# Patient Record
Sex: Male | Born: 1980
Health system: Southern US, Community
[De-identification: ages and names within clinical notes are randomized; demographics above are authoritative.]

## PROBLEM LIST (undated history)

## (undated) DIAGNOSIS — K819 Cholecystitis, unspecified: Secondary | ICD-10-CM

## (undated) DIAGNOSIS — K859 Acute pancreatitis without necrosis or infection, unspecified: Secondary | ICD-10-CM

## (undated) DIAGNOSIS — E669 Obesity, unspecified: Secondary | ICD-10-CM

## (undated) DIAGNOSIS — K8020 Calculus of gallbladder without cholecystitis without obstruction: Secondary | ICD-10-CM

## (undated) HISTORY — PX: HAND SURGERY: SHX662

---

## 2011-09-21 ENCOUNTER — Emergency Department (HOSPITAL_BASED_OUTPATIENT_CLINIC_OR_DEPARTMENT_OTHER)
Admission: EM | Admit: 2011-09-21 | Discharge: 2011-09-21 | Disposition: A | Discharge status: Home / Self Care | Payer: Self-pay | Attending: Emergency Medicine | Admitting: Emergency Medicine

## 2011-09-21 ENCOUNTER — Encounter (HOSPITAL_BASED_OUTPATIENT_CLINIC_OR_DEPARTMENT_OTHER): Payer: Self-pay | Admitting: Family Medicine

## 2011-09-21 ENCOUNTER — Emergency Department (HOSPITAL_BASED_OUTPATIENT_CLINIC_OR_DEPARTMENT_OTHER): Payer: Self-pay

## 2011-09-21 DIAGNOSIS — M7050 Other bursitis of knee, unspecified knee: Secondary | ICD-10-CM

## 2011-09-21 DIAGNOSIS — F17200 Nicotine dependence, unspecified, uncomplicated: Secondary | ICD-10-CM | POA: Insufficient documentation

## 2011-09-21 DIAGNOSIS — M7040 Prepatellar bursitis, unspecified knee: Secondary | ICD-10-CM | POA: Insufficient documentation

## 2011-09-21 MED ORDER — NAPROXEN 500 MG PO TABS: 500.0000 mg | ORAL_TABLET | Freq: Two times a day (BID) | ORAL | Status: DC

## 2011-09-21 MED ORDER — OXYCODONE-ACETAMINOPHEN 5-325 MG PO TABS: 1.0000 | ORAL_TABLET | Freq: Four times a day (QID) | ORAL | Status: AC | PRN

## 2011-09-21 NOTE — ED Notes (Signed)
Pt c/o right knee pain x 3 days while kneeling on it. Pt sts pain worse with bending. Pt using ice and aleve at home for pain control. Pt reports injury to same in past.

## 2011-09-21 NOTE — ED Provider Notes (Addendum)
History     CSN: [id number]  Arrival date & time 09/21/11  1004   First MD Initiated Contact with Patient 09/21/11 1042      Chief Complaint  Patient presents with  . Knee Pain    (Consider location/radiation/quality/duration/timing/severity/associated sxs/prior treatment) Patient is a 31 y.o. male presenting with knee pain. The history is provided by the patient.  Knee Pain This is a new problem. Episode onset: 3 days. The problem occurs constantly. The problem has been gradually improving. Associated symptoms comments: No fever, pain with prolonged standing. The symptoms are aggravated by standing and walking. The symptoms are relieved by ice and relaxation. He has tried rest (Ice and ibuprofen) for the symptoms. The treatment provided mild relief.    History reviewed. No pertinent past medical history.  History reviewed. No pertinent past surgical history.  No family history on file.  History  Substance Use Topics  . Smoking status: Current Some Day Smoker  . Smokeless tobacco: Not on file  . Alcohol Use: Yes     occ      Review of Systems  All other systems reviewed and are negative.    Allergies  Review of patient's allergies indicates no known allergies.  Home Medications  No current outpatient prescriptions on file.  BP 145/82  Pulse 80  Temp 97.9 F (36.6 C) (Oral)  Resp 20  Ht 6\' 2"  (1.88 m)  Wt 355 lb (161.027 kg)  BMI 45.58 kg/m2  SpO2 100%  Physical Exam  Nursing note and vitals reviewed. Constitutional: He is oriented to person, place, and time. He appears well-developed and well-nourished. No distress.  HENT:  Head: Normocephalic and atraumatic.  Mouth/Throat: Oropharynx is clear and moist.  Eyes: Conjunctivae and EOM are normal. Pupils are equal, round, and reactive to light.  Neck: Normal range of motion. Neck supple.  Musculoskeletal: Normal range of motion. He exhibits no edema and no tenderness.       Right knee: He exhibits  swelling, effusion and erythema. He exhibits normal range of motion.       Legs: Neurological: He is alert and oriented to person, place, and time.  Skin: Skin is warm and dry. No rash noted. No erythema.  Psychiatric: He has a normal mood and affect. His behavior is normal.    ED Course  Procedures (including critical care time)  Labs Reviewed - No data to display Dg Knee Complete 4 Views Right  09/21/2011  *RADIOLOGY REPORT*  Clinical Data: Knee pain and swelling, fall 1 month ago  RIGHT KNEE - COMPLETE 4+ VIEW  Comparison: None.  Findings: Mild medial compartmental degenerative change is noted. No suprapatellar effusion.  No fracture dislocation.  Radiopaque foreign body.  IMPRESSION: Mild medial compartmental degenerative change.  No acute finding.  Original Report Authenticated By: Harrel Lemon, M.D.     1. Patellar bursitis       MDM   Patient with findings most suggestive of a patellar bursitis. He states he injured his knee one month ago but 3 days ago with kneeling on and it's swelled causing pain and redness. On exam however patient complexes knee past 90 and this does not appear to be septic joint. Plain film shows mild arthritis but otherwise within normal limits. Patient's patella bursa is erythematous and warm. Patient was placed on anti-inflammatories and given pain control. He was given followup to sports med       Gwyneth Sprout, MD 09/21/11 1110  Gwyneth Sprout, MD 09/21/11 1112

## 2011-09-21 NOTE — ED Notes (Signed)
Patient reports injury to the right knee about 1 month ago; patient reports increase pain, swelling and redness this week.

## 2012-03-08 ENCOUNTER — Other Ambulatory Visit: Payer: Self-pay | Admitting: Family Medicine

## 2012-03-08 DIAGNOSIS — R109 Unspecified abdominal pain: Secondary | ICD-10-CM

## 2012-03-14 ENCOUNTER — Ambulatory Visit
Admission: RE | Admit: 2012-03-14 | Discharge: 2012-03-14 | Disposition: A | Discharge status: Home / Self Care | Payer: Self-pay | Source: Ambulatory Visit | Attending: Family Medicine | Admitting: Family Medicine

## 2012-03-14 DIAGNOSIS — R109 Unspecified abdominal pain: Secondary | ICD-10-CM

## 2012-03-18 ENCOUNTER — Encounter (INDEPENDENT_AMBULATORY_CARE_PROVIDER_SITE_OTHER): Payer: Self-pay | Admitting: Surgery

## 2012-03-22 ENCOUNTER — Ambulatory Visit (INDEPENDENT_AMBULATORY_CARE_PROVIDER_SITE_OTHER): Payer: 59 | Admitting: Surgery

## 2012-03-22 ENCOUNTER — Encounter (INDEPENDENT_AMBULATORY_CARE_PROVIDER_SITE_OTHER): Payer: Self-pay | Admitting: Surgery

## 2012-03-22 VITALS — BP 119/82 | HR 75 | Temp 98.4°F | Resp 14 | Ht 74.0 in | Wt 373.2 lb

## 2012-03-22 DIAGNOSIS — Z6841 Body Mass Index (BMI) 40.0 and over, adult: Secondary | ICD-10-CM | POA: Insufficient documentation

## 2012-03-22 DIAGNOSIS — K8010 Calculus of gallbladder with chronic cholecystitis without obstruction: Secondary | ICD-10-CM

## 2012-03-22 NOTE — Progress Notes (Signed)
Patient ID: Adam Rosario, male   DOB: 11-Jan-1981, 32 y.o.   MRN: [id number]  Chief Complaint  Patient presents with  . Cholelithiasis    HPI Adam Rosario is a 32 y.o. male.  Referred by Dr. Mila Palmer for evaluation of gallbladder disease  HPI This is a 32 year old male who presents with a recent history of right upper quadrant abdominal pain. This does get worse with eating. This pain is associated with nausea and vomiting. This tends to occur more at night. He denies any fever. His appetite has been poor for the last several days. He was diagnosed with gallstones several years ago but never had surgery. Dr. Paulino Rily obtained a CBC and liver function tests which were all normal. An ultrasound dated 03/14/12 showed multiple gallstones but no sign of cholecystitis. The patient is now referred for surgical evaluation.  PMH - Morbid obesity - BMI 48  Past Surgical History  Procedure Date  . Hand surgery     left    No family history on file.  Social History History  Substance Use Topics  . Smoking status: Current Some Day Smoker -- 0.5 packs/day    Types: Cigarettes  . Smokeless tobacco: Not on file  . Alcohol Use: Yes     Comment: occ    No Known Allergies  Current Outpatient Prescriptions  Medication Sig Dispense Refill  . naproxen sodium (ANAPROX) 220 MG tablet Take 220 mg by mouth as needed.        Review of Systems Review of Systems  Constitutional: Negative for fever, chills and unexpected weight change.  HENT: Negative for hearing loss, congestion, sore throat, trouble swallowing and voice change.   Eyes: Negative for visual disturbance.  Respiratory: Negative for cough and wheezing.   Cardiovascular: Negative for chest pain, palpitations and leg swelling.  Gastrointestinal: Positive for nausea, vomiting, abdominal pain and abdominal distention. Negative for diarrhea, constipation, blood in stool, anal bleeding and rectal pain.  Genitourinary: Negative for  hematuria and difficulty urinating.  Musculoskeletal: Negative for arthralgias.  Skin: Negative for rash and wound.  Neurological: Negative for seizures, syncope, weakness and headaches.  Hematological: Negative for adenopathy. Does not bruise/bleed easily.  Psychiatric/Behavioral: Negative for confusion.    Blood pressure 119/82, pulse 75, temperature 98.4 F (36.9 C), temperature source Temporal, resp. rate 14, height 6\' 2"  (1.88 m), weight 373 lb 3.2 oz (169.282 kg).  Physical Exam Physical Exam Obese male in NAD HEENT:  EOMI, sclera anicteric Neck:  No masses, no thyromegaly Lungs:  CTA bilaterally; normal respiratory effort CV:  Regular rate and rhythm; no murmurs Abd:  +bowel sounds, obese; soft, mild RUQ tenderness Ext:  Well-perfused; no edema Skin:  Warm, dry; no sign of jaundice   Data Reviewed RADIOLOGY REPORT*  Clinical Data: Abdominal pain. Nausea and vomiting.  ABDOMINAL ULTRASOUND COMPLETE  Comparison: None.  Findings: Technically difficult exam due to body habitus.  Gallbladder: Multiple gallstones are seen measuring up to  approximately centimeters in diameter. There is no evidence of  gallbladder dilatation wall thickening. No sonographic Murphy's  sign noted by sonographer.  Common Bile Duct: Within normal limits in caliber. Measures 4 mm  in diameter.  Liver: Diffusely increased parenchymal echogenicity, consistent  with hepatic steatosis. No focal liver mass identified.  IVC: Not well visualized due to patient habitus and steatosis.  Pancreas: Not well visualized due to habitus and overlying bowel  gas.  Spleen: Within normal limits in size and echotexture.  Right kidney: Normal in  size and parenchymal echogenicity. No  evidence of mass or hydronephrosis.  Left kidney: Normal in size and parenchymal echogenicity. No  evidence of mass or hydronephrosis.  Abdominal Aorta: No aneurysm identified.  IMPRESSION:  1. Cholelithiasis. No definite signs of  acute cholecystitis or  biliary dilatation.  2. Hepatic steatosis.  3. Technically difficult exam.  Original Report Authenticated By: Myles Rosenthal, M.D.      Assessment    Chronic calculus cholecystitis Morbid obesity    Plan    Laparoscopic cholecystectomy with intraoperative cholangiogram.  The surgical procedure has been discussed with the patient.  Potential risks, benefits, alternative treatments, and expected outcomes have been explained.  All of the patient's questions at this time have been answered.  The likelihood of reaching the patient's treatment goal is good.  The patient understand the proposed surgical procedure and wishes to proceed.        Yani Lal K. 03/22/2012, 12:26 PM

## 2013-02-12 ENCOUNTER — Encounter (HOSPITAL_BASED_OUTPATIENT_CLINIC_OR_DEPARTMENT_OTHER): Payer: Self-pay | Admitting: Emergency Medicine

## 2013-02-12 ENCOUNTER — Emergency Department (HOSPITAL_BASED_OUTPATIENT_CLINIC_OR_DEPARTMENT_OTHER)
Admission: EM | Admit: 2013-02-12 | Discharge: 2013-02-12 | Disposition: A | Discharge status: Home / Self Care | Payer: 59 | Attending: Emergency Medicine | Admitting: Emergency Medicine

## 2013-02-12 DIAGNOSIS — K219 Gastro-esophageal reflux disease without esophagitis: Secondary | ICD-10-CM | POA: Insufficient documentation

## 2013-02-12 DIAGNOSIS — F17200 Nicotine dependence, unspecified, uncomplicated: Secondary | ICD-10-CM | POA: Insufficient documentation

## 2013-02-12 MED ORDER — PANTOPRAZOLE SODIUM 20 MG PO TBEC: 20.0000 mg | DELAYED_RELEASE_TABLET | Freq: Two times a day (BID) | ORAL | Status: DC

## 2013-02-12 MED ORDER — GI COCKTAIL ~~LOC~~
30.0000 mL | Freq: Once | ORAL | Status: AC
  Administered 2013-02-12: 30 mL via ORAL
  Filled 2013-02-12: qty 30

## 2013-02-12 NOTE — ED Notes (Signed)
Pt c/o " severe heartburn" x 2 days

## 2013-02-12 NOTE — ED Notes (Signed)
EKG Canceled by MD Ray

## 2013-02-12 NOTE — ED Provider Notes (Addendum)
CSN: [id number]     Arrival date & time 02/12/13  1417 History   First MD Initiated Contact with Patient 02/12/13 1504     Chief Complaint  Patient presents with  . Heartburn   (Consider location/radiation/quality/duration/timing/severity/associated sxs/prior Treatment) HPI This is a morbidly obese 32 year old male who comes today complaining that he has had some of yellowish type material reflux into the back of his throat intermittently over the past several days.  He states that he was diagnosed during the past year but gallstones but was unable to have surgery. He states he has been eating better since that time but 8 some pizza several days ago with a "messed with him". He states he has some occasional pain in the right upper quadrant but more with moving van with food. He has had a couple episodes of vomiting but states it has chiefly just been reflux the back of his throat. He states there is some slight burning with this but is not really complaining of any pain. He denies any associated symptoms such as fever, chills, diarrhea, or shortness of breath. He has had some weight loss does not think it has very much. He is a smoker.He drinks some alcohol but has not had any recently History reviewed. No pertinent past medical history. Past Surgical History  Procedure Laterality Date  . Hand surgery      left   History reviewed. No pertinent family history. History  Substance Use Topics  . Smoking status: Current Some Day Smoker -- 0.50 packs/day    Types: Cigarettes  . Smokeless tobacco: Not on file  . Alcohol Use: Yes     Comment: occ    Review of Systems  All other systems reviewed and are negative.    Allergies  Review of patient's allergies indicates no known allergies.  Home Medications  No current outpatient prescriptions on file. BP 156/99  Pulse 65  Temp(Src) 98.1 F (36.7 C) (Oral)  Resp 16  Ht 6\' 1"  (1.854 m)  Wt 375 lb (170.099 kg)  BMI 49.49 kg/m2  SpO2  99% Physical Exam  Nursing note and vitals reviewed. Constitutional: He is oriented to person, place, and time. He appears well-developed and well-nourished.  Morbidly obese male  HENT:  Head: Normocephalic and atraumatic.  Right Ear: External ear normal.  Left Ear: External ear normal.  Nose: Nose normal.  Mouth/Throat: Oropharynx is clear and moist.  Eyes: Conjunctivae and EOM are normal. Pupils are equal, round, and reactive to light.  Neck: Normal range of motion. Neck supple. No tracheal deviation present. No thyromegaly present.  Cardiovascular: Normal rate, normal heart sounds and intact distal pulses.   Pulmonary/Chest: Effort normal and breath sounds normal.  Abdominal: Soft. Bowel sounds are normal. He exhibits no mass. There is no tenderness. There is no rebound and no guarding.  Musculoskeletal: Normal range of motion.  Neurological: He is alert and oriented to person, place, and time. He has normal reflexes.  Skin: Skin is warm and dry.  Psychiatric: He has a normal mood and affect. His behavior is normal. Thought content normal.    ED Course  Procedures (including critical care time) Labs Review Labs Reviewed - No data to display Imaging Review No results found.  EKG Interpretation   None       MDM  No diagnosis found. Patient with symptoms consistent with reflux. He does not have pain and I have no concerns for this representing cardiac problems. I have discussed with him multiple  intervention she can do such as raising the head of his bed, weight loss, smoking cessation, and diet control. He voices understanding. He'll be placed on a short course of Protonix and advised to followup with her primary care physician. He does state that he is a note for work for [employer] and does not want to come in this evening.   Hilario Quarry, MD 02/12/13 1610  Hilario Quarry, MD 02/12/13 [phone]

## 2013-02-24 ENCOUNTER — Encounter (HOSPITAL_COMMUNITY): Payer: Self-pay | Admitting: Emergency Medicine

## 2013-02-24 ENCOUNTER — Other Ambulatory Visit: Payer: Self-pay | Admitting: Family Medicine

## 2013-02-24 ENCOUNTER — Emergency Department (HOSPITAL_COMMUNITY): Payer: 59

## 2013-02-24 ENCOUNTER — Inpatient Hospital Stay (HOSPITAL_COMMUNITY)
Admission: EM | Admit: 2013-02-24 | Discharge: 2013-02-28 | DRG: 417 | Disposition: A | Discharge status: Home / Self Care | Payer: 59 | Attending: Surgery | Admitting: Surgery

## 2013-02-24 DIAGNOSIS — K851 Biliary acute pancreatitis without necrosis or infection: Secondary | ICD-10-CM

## 2013-02-24 DIAGNOSIS — F17200 Nicotine dependence, unspecified, uncomplicated: Secondary | ICD-10-CM | POA: Diagnosis present

## 2013-02-24 DIAGNOSIS — K859 Acute pancreatitis without necrosis or infection, unspecified: Secondary | ICD-10-CM

## 2013-02-24 DIAGNOSIS — K8010 Calculus of gallbladder with chronic cholecystitis without obstruction: Secondary | ICD-10-CM | POA: Diagnosis present

## 2013-02-24 DIAGNOSIS — Z6841 Body Mass Index (BMI) 40.0 and over, adult: Secondary | ICD-10-CM

## 2013-02-24 DIAGNOSIS — K831 Obstruction of bile duct: Secondary | ICD-10-CM | POA: Diagnosis present

## 2013-02-24 DIAGNOSIS — K819 Cholecystitis, unspecified: Secondary | ICD-10-CM

## 2013-02-24 DIAGNOSIS — R1011 Right upper quadrant pain: Secondary | ICD-10-CM

## 2013-02-24 DIAGNOSIS — K8020 Calculus of gallbladder without cholecystitis without obstruction: Secondary | ICD-10-CM

## 2013-02-24 HISTORY — DX: Obesity, unspecified: E66.9

## 2013-02-24 HISTORY — DX: Acute pancreatitis without necrosis or infection, unspecified: K85.90

## 2013-02-24 HISTORY — DX: Cholecystitis, unspecified: K81.9

## 2013-02-24 HISTORY — DX: Calculus of gallbladder without cholecystitis without obstruction: K80.20

## 2013-02-24 LAB — URINALYSIS, ROUTINE W REFLEX MICROSCOPIC
GLUCOSE, UA: NEGATIVE mg/dL
Hgb urine dipstick: NEGATIVE
Ketones, ur: 15 mg/dL — AB
Nitrite: NEGATIVE
PH: 6 (ref 5.0–8.0)
Protein, ur: NEGATIVE mg/dL
Specific Gravity, Urine: 1.025 (ref 1.005–1.030)
Urobilinogen, UA: 1 mg/dL (ref 0.0–1.0)

## 2013-02-24 LAB — CBC WITH DIFFERENTIAL/PLATELET
BASOS ABS: 0 10*3/uL (ref 0.0–0.1)
BASOS PCT: 0 % (ref 0–1)
Eosinophils Absolute: 0.1 10*3/uL (ref 0.0–0.7)
Eosinophils Relative: 1 % (ref 0–5)
HEMATOCRIT: 41.3 % (ref 39.0–52.0)
Hemoglobin: 13.8 g/dL (ref 13.0–17.0)
Lymphocytes Relative: 32 % (ref 12–46)
Lymphs Abs: 1.6 10*3/uL (ref 0.7–4.0)
MCH: 28.3 pg (ref 26.0–34.0)
MCHC: 33.4 g/dL (ref 30.0–36.0)
MCV: 84.6 fL (ref 78.0–100.0)
MONO ABS: 0.6 10*3/uL (ref 0.1–1.0)
Monocytes Relative: 13 % — ABNORMAL HIGH (ref 3–12)
NEUTROS ABS: 2.6 10*3/uL (ref 1.7–7.7)
NEUTROS PCT: 53 % (ref 43–77)
PLATELETS: 305 10*3/uL (ref 150–400)
RBC: 4.88 MIL/uL (ref 4.22–5.81)
RDW: 14.4 % (ref 11.5–15.5)
WBC: 4.9 10*3/uL (ref 4.0–10.5)

## 2013-02-24 LAB — URINE MICROSCOPIC-ADD ON

## 2013-02-24 LAB — POCT I-STAT TROPONIN I: TROPONIN I, POC: 0 ng/mL (ref 0.00–0.08)

## 2013-02-24 LAB — LIPASE, BLOOD: Lipase: 804 U/L — ABNORMAL HIGH (ref 11–59)

## 2013-02-24 LAB — COMPREHENSIVE METABOLIC PANEL
ALBUMIN: 3.5 g/dL (ref 3.5–5.2)
ALT: 286 U/L — AB (ref 0–53)
AST: 128 U/L — AB (ref 0–37)
Alkaline Phosphatase: 318 U/L — ABNORMAL HIGH (ref 39–117)
BILIRUBIN TOTAL: 2.5 mg/dL — AB (ref 0.3–1.2)
BUN: 7 mg/dL (ref 6–23)
CHLORIDE: 103 meq/L (ref 96–112)
CO2: 26 mEq/L (ref 19–32)
CREATININE: 0.6 mg/dL (ref 0.50–1.35)
Calcium: 9.1 mg/dL (ref 8.4–10.5)
GFR calc Af Amer: >90 mL/min (ref >90)
GFR calc non Af Amer: >90 mL/min (ref >90)
Glucose, Bld: 103 mg/dL — ABNORMAL HIGH (ref 70–99)
Potassium: 3.9 mEq/L (ref 3.7–5.3)
SODIUM: 141 meq/L (ref 137–147)
Total Protein: 7.3 g/dL (ref 6.0–8.3)

## 2013-02-24 MED ORDER — MORPHINE SULFATE 4 MG/ML IJ SOLN
4.0000 mg | INTRAMUSCULAR | Status: DC | PRN
  Administered 2013-02-24: 4 mg via INTRAVENOUS
  Filled 2013-02-24: qty 1

## 2013-02-24 MED ORDER — ONDANSETRON HCL 4 MG/2ML IJ SOLN
4.0000 mg | Freq: Four times a day (QID) | INTRAMUSCULAR | Status: DC | PRN
Start: 2013-02-24 — End: 2013-02-28
  Administered 2013-02-24: 4 mg via INTRAVENOUS
  Filled 2013-02-24: qty 2

## 2013-02-24 NOTE — H&P (Signed)
Adam Rosario is an 33 y.o. male.   Chief Complaint: abdominal pain referred by Dr Rae Mar HPI: 68 yom who is morbidly obese and saw Dr Georgette Dover last year with symptomatic cholelithiasis.  He decided not to undergo surgery at that time for financial reasons.  Since then he has had some ruq pain intermittently after eating that goes away after a couple hours.  The current episode has lasted two days and consists of nausea, loose stools, ruq and epigastric pain that is not resolving. Nothing at home will help.  He has not been eating at all.  He saw pcp today and was referred to er after undergoing lab evaluation.    Past Medical History  Diagnosis Date  . Gallstones   morbid obesity  Past Surgical History  Procedure Laterality Date  . Hand surgery      left    No family history on file. Social History:  reports that he has been smoking Cigarettes.  He has been smoking about 0.50 packs per day. He does not have any smokeless tobacco history on file. He reports that he drinks alcohol. He reports that he does not use illicit drugs.  Allergies: No Known Allergies Meds reviewed  Results for orders placed during the hospital encounter of 02/24/13 (from the past 48 hour(s))  CBC WITH DIFFERENTIAL     Status: Abnormal   Collection Time    02/24/13  4:17 PM      Result Value Range   WBC 4.9  4.0 - 10.5 K/uL   RBC 4.88  4.22 - 5.81 MIL/uL   Hemoglobin 13.8  13.0 - 17.0 g/dL   HCT 41.3  39.0 - 52.0 %   MCV 84.6  78.0 - 100.0 fL   MCH 28.3  26.0 - 34.0 pg   MCHC 33.4  30.0 - 36.0 g/dL   RDW 14.4  11.5 - 15.5 %   Platelets 305  150 - 400 K/uL   Neutrophils Relative % 53  43 - 77 %   Neutro Abs 2.6  1.7 - 7.7 K/uL   Lymphocytes Relative 32  12 - 46 %   Lymphs Abs 1.6  0.7 - 4.0 K/uL   Monocytes Relative 13 (*) 3 - 12 %   Monocytes Absolute 0.6  0.1 - 1.0 K/uL   Eosinophils Relative 1  0 - 5 %   Eosinophils Absolute 0.1  0.0 - 0.7 K/uL   Basophils Relative 0  0 - 1 %   Basophils Absolute  0.0  0.0 - 0.1 K/uL  COMPREHENSIVE METABOLIC PANEL     Status: Abnormal   Collection Time    02/24/13  4:17 PM      Result Value Range   Sodium 141  137 - 147 mEq/L   Potassium 3.9  3.7 - 5.3 mEq/L   Chloride 103  96 - 112 mEq/L   CO2 26  19 - 32 mEq/L   Glucose, Bld 103 (*) 70 - 99 mg/dL   BUN 7  6 - 23 mg/dL   Creatinine, Ser 0.60  0.50 - 1.35 mg/dL   Calcium 9.1  8.4 - 10.5 mg/dL   Total Protein 7.3  6.0 - 8.3 g/dL   Albumin 3.5  3.5 - 5.2 g/dL   AST 128 (*) 0 - 37 U/L   ALT 286 (*) 0 - 53 U/L   Alkaline Phosphatase 318 (*) 39 - 117 U/L   Total Bilirubin 2.5 (*) 0.3 - 1.2 mg/dL   GFR  calc non Af Amer >90  >90 mL/min   GFR calc Af Amer >90  >90 mL/min   Comment: (NOTE)     The eGFR has been calculated using the CKD EPI equation.     This calculation has not been validated in all clinical situations.     eGFR's persistently <90 mL/min signify possible Chronic Kidney     Disease.  LIPASE, BLOOD     Status: Abnormal   Collection Time    02/24/13  4:17 PM      Result Value Range   Lipase 804 (*) 11 - 59 U/L  URINALYSIS, ROUTINE W REFLEX MICROSCOPIC     Status: Abnormal   Collection Time    02/24/13  4:36 PM      Result Value Range   Color, Urine AMBER (*) YELLOW   Comment: BIOCHEMICALS MAY BE AFFECTED BY COLOR   APPearance CLEAR  CLEAR   Specific Gravity, Urine 1.025  1.005 - 1.030   pH 6.0  5.0 - 8.0   Glucose, UA NEGATIVE  NEGATIVE mg/dL   Hgb urine dipstick NEGATIVE  NEGATIVE   Bilirubin Urine MODERATE (*) NEGATIVE   Ketones, ur 15 (*) NEGATIVE mg/dL   Protein, ur NEGATIVE  NEGATIVE mg/dL   Urobilinogen, UA 1.0  0.0 - 1.0 mg/dL   Nitrite NEGATIVE  NEGATIVE   Leukocytes, UA TRACE (*) NEGATIVE  URINE MICROSCOPIC-ADD ON     Status: None   Collection Time    02/24/13  4:36 PM      Result Value Range   Squamous Epithelial / LPF RARE  RARE   WBC, UA 0-2  <3 WBC/hpf   RBC / HPF 0-2  <3 RBC/hpf   Bacteria, UA RARE  RARE  POCT I-STAT TROPONIN I     Status: None    Collection Time    02/24/13  4:53 PM      Result Value Range   Troponin i, poc 0.00  0.00 - 0.08 ng/mL   Comment 3            Comment: Due to the release kinetics of cTnI,     a negative result within the first hours     of the onset of symptoms does not rule out     myocardial infarction with certainty.     If myocardial infarction is still suspected,     repeat the test at appropriate intervals.   US Abdomen Limited  02/24/2013   CLINICAL DATA:  Right upper quadrant abdominal pain. Current history of cholelithiasis.  EXAM: US ABDOMEN LIMITED - RIGHT UPPER QUADRANT  COMPARISON:  Abdominal ultrasound 03/14/2012.  FINDINGS: Gallbladder  Multiple shadowing gallstones, the largest approximating 10 mm. Mild gallbladder wall thickening up to approximately 5 mm. No pericholecystic fluid. Positive sonographic Murphy sign according to the ultrasound technologist.  Common bile duct  Diameter: 5 mm.  Liver:  Diffusely increased and coarsened echotexture without focal hepatic parenchymal abnormality. Patent portal vein with hepatopetal flow.  IMPRESSION: 1. Cholelithiasis and sonographic findings which are consistent with acute cholecystitis (gallbladder wall thickening and positive sonographic Murphy sign). 2. No biliary ductal dilation. 3. Diffuse hepatic steatosis and/or hepatocellular disease without focal hepatic parenchymal abnormality.   Electronically Signed   By: Evangeline Dakin M.D.   On: 02/24/2013 22:21    Review of Systems  Constitutional: Negative for fever.  Respiratory: Negative for shortness of breath.   Cardiovascular: Negative for chest pain.  Gastrointestinal: Positive for nausea, vomiting and abdominal pain.  Blood pressure 131/79, pulse 59, temperature 98.9 F (37.2 C), temperature source Oral, resp. rate 14, weight 375 lb 8 oz (170.326 kg), SpO2 98.00%. Physical Exam  Vitals reviewed. Constitutional: He appears well-developed and well-nourished.  Eyes: No scleral icterus.   Neck: Neck supple.  Cardiovascular: Normal rate, regular rhythm and normal heart sounds.   Respiratory: Effort normal and breath sounds normal. He has no wheezes. He has no rales.  GI: Soft. Bowel sounds are normal. He exhibits no distension. There is tenderness in the right upper quadrant and epigastric area.  Lymphadenopathy:    He has no cervical adenopathy.     Assessment/Plan Gallstone pancreatitis  Admission, npo, recheck labs in am, plan for lap chole this admission, discussed with patient and wife  [facility] 02/24/2013, 10:37 PM

## 2013-02-24 NOTE — ED Notes (Signed)
Pt reports he was advised last year to have his gall bladder removed, pt's insurance was only going to cover part of the procedure and therefore the pt was unable to have the procedure.

## 2013-02-24 NOTE — ED Notes (Signed)
Pt is here with right upper abdominal pain and known history of gallstones and last checked last year.  Pt reports vomiting yellow bile.  Stool is clear liquid.  No chest pain or sob

## 2013-02-24 NOTE — ED Provider Notes (Signed)
CSN: [id number]     Arrival date & time 02/24/13  1606 History   First MD Initiated Contact with Patient 02/24/13 2006     Chief Complaint  Patient presents with  . Abdominal Pain   (Consider location/radiation/quality/duration/timing/severity/associated sxs/prior Treatment) HPI Comments: Pt with hx of gall stones comes in with cc of abd pain. Pt has abd pain on the RUQ and epigastrium, started 2 days ago and is constant - with no specific aggravating or relieving factors. Pt has nausea and emesis, and loose BM. No blood in the stool. Saw surgeon last year after the dx, but didn't follow up thereafter.  Patient is a 33 y.o. male presenting with abdominal pain. The history is provided by the patient.  Abdominal Pain Associated symptoms: nausea and vomiting   Associated symptoms: no chest pain, no cough, no dysuria and no shortness of breath     Past Medical History  Diagnosis Date  . Gallstones    Past Surgical History  Procedure Laterality Date  . Hand surgery      left   No family history on file. History  Substance Use Topics  . Smoking status: Current Some Day Smoker -- 0.50 packs/day    Types: Cigarettes  . Smokeless tobacco: Not on file  . Alcohol Use: Yes     Comment: occ    Review of Systems  Constitutional: Negative for activity change and appetite change.  Respiratory: Negative for cough and shortness of breath.   Cardiovascular: Negative for chest pain.  Gastrointestinal: Positive for nausea, vomiting and abdominal pain. Negative for blood in stool.  Genitourinary: Negative for dysuria and flank pain.  Hematological: Does not bruise/bleed easily.    Allergies  Review of patient's allergies indicates no known allergies.  Home Medications   Current Outpatient Rx  Name  Route  Sig  Dispense  Refill  . Naproxen Sodium (ALEVE) 220 MG CAPS   Oral   Take 220 mg by mouth every 8 (eight) hours as needed (pain).         . pantoprazole (PROTONIX) 20 MG  tablet   Oral   Take 1 tablet (20 mg total) by mouth 2 (two) times daily.   40 tablet   0    BP 131/79  Pulse 59  Temp(Src) 98.9 F (37.2 C) (Oral)  Resp 14  Wt 375 lb 8 oz (170.326 kg)  SpO2 98% Physical Exam  Nursing note and vitals reviewed. Constitutional: He is oriented to person, place, and time. He appears well-developed.  HENT:  Head: Normocephalic and atraumatic.  Eyes: Conjunctivae and EOM are normal. Pupils are equal, round, and reactive to light.  Neck: Normal range of motion. Neck supple.  Cardiovascular: Normal rate and regular rhythm.   Pulmonary/Chest: Effort normal and breath sounds normal.  Abdominal: Soft. Bowel sounds are normal. He exhibits no distension. There is tenderness. There is guarding. There is no rebound.  RUQ tenderness - neg murphy's  Neurological: He is alert and oriented to person, place, and time.  Skin: Skin is warm.    ED Course  Procedures (including critical care time) Labs Review Labs Reviewed  CBC WITH DIFFERENTIAL - Abnormal; Notable for the following:    Monocytes Relative 13 (*)    All other components within normal limits  COMPREHENSIVE METABOLIC PANEL - Abnormal; Notable for the following:    Glucose, Bld 103 (*)    AST 128 (*)    ALT 286 (*)    Alkaline Phosphatase 318 (*)  Total Bilirubin 2.5 (*)    All other components within normal limits  LIPASE, BLOOD - Abnormal; Notable for the following:    Lipase 804 (*)    All other components within normal limits  URINALYSIS, ROUTINE W REFLEX MICROSCOPIC - Abnormal; Notable for the following:    Color, Urine AMBER (*)    Bilirubin Urine MODERATE (*)    Ketones, ur 15 (*)    Leukocytes, UA TRACE (*)    All other components within normal limits  URINE MICROSCOPIC-ADD ON  POCT I-STAT TROPONIN I   Imaging Review US Abdomen Limited  02/24/2013   CLINICAL DATA:  Right upper quadrant abdominal pain. Current history of cholelithiasis.  EXAM: US ABDOMEN LIMITED - RIGHT UPPER  QUADRANT  COMPARISON:  Abdominal ultrasound 03/14/2012.  FINDINGS: Gallbladder  Multiple shadowing gallstones, the largest approximating 10 mm. Mild gallbladder wall thickening up to approximately 5 mm. No pericholecystic fluid. Positive sonographic Murphy sign according to the ultrasound technologist.  Common bile duct  Diameter: 5 mm.  Liver:  Diffusely increased and coarsened echotexture without focal hepatic parenchymal abnormality. Patent portal vein with hepatopetal flow.  IMPRESSION: 1. Cholelithiasis and sonographic findings which are consistent with acute cholecystitis (gallbladder wall thickening and positive sonographic Murphy sign). 2. No biliary ductal dilation. 3. Diffuse hepatic steatosis and/or hepatocellular disease without focal hepatic parenchymal abnormality.   Electronically Signed   By: Hulan Saas M.D.   On: 02/24/2013 22:21    EKG Interpretation    Date/Time:  Monday February 24 2013 16:17:39 EST Ventricular Rate:  70 PR Interval:  158 QRS Duration: 82 QT Interval:  376 QTC Calculation: 406 R Axis:   64 Text Interpretation:  Normal sinus rhythm with sinus arrhythmia q wave III TWI III, and aVF Confirmed by Juleen China  MD, STEPHEN ([id number]) on 02/24/2013 4:25:35 PM            MDM   1. Cholecystitis   2. Pancreatitis   3. Gall stone pancreatitis    Pt comes in with cc of abd pain. Known hx of gall stones, and lab shows elevated lipase. [place] confirms cholecystitis - and surgery has admitted the patient. Pt informed of the findings.   Derwood Kaplan, MD 02/24/13 2259

## 2013-02-24 NOTE — ED Notes (Signed)
Attempted to give report 

## 2013-02-24 NOTE — ED Notes (Addendum)
[facility], states pt was sent from their office for abdominal pain with elevated lipid levels.

## 2013-02-25 ENCOUNTER — Encounter (HOSPITAL_COMMUNITY): Payer: Self-pay | Admitting: General Practice

## 2013-02-25 LAB — CBC
HEMATOCRIT: 38.7 % — AB (ref 39.0–52.0)
Hemoglobin: 13 g/dL (ref 13.0–17.0)
MCH: 28 pg (ref 26.0–34.0)
MCHC: 33.6 g/dL (ref 30.0–36.0)
MCV: 83.2 fL (ref 78.0–100.0)
Platelets: 301 10*3/uL (ref 150–400)
RBC: 4.65 MIL/uL (ref 4.22–5.81)
RDW: 14.5 % (ref 11.5–15.5)
WBC: 5.3 10*3/uL (ref 4.0–10.5)

## 2013-02-25 LAB — COMPREHENSIVE METABOLIC PANEL
ALK PHOS: 291 U/L — AB (ref 39–117)
ALT: 264 U/L — AB (ref 0–53)
AST: 144 U/L — ABNORMAL HIGH (ref 0–37)
Albumin: 3.3 g/dL — ABNORMAL LOW (ref 3.5–5.2)
BUN: 7 mg/dL (ref 6–23)
CO2: 25 mEq/L (ref 19–32)
Calcium: 8.9 mg/dL (ref 8.4–10.5)
Chloride: 102 mEq/L (ref 96–112)
Creatinine, Ser: 0.67 mg/dL (ref 0.50–1.35)
GFR calc non Af Amer: >90 mL/min (ref >90)
GLUCOSE: 88 mg/dL (ref 70–99)
POTASSIUM: 4.2 meq/L (ref 3.7–5.3)
Sodium: 141 mEq/L (ref 137–147)
Total Bilirubin: 1.9 mg/dL — ABNORMAL HIGH (ref 0.3–1.2)
Total Protein: 6.9 g/dL (ref 6.0–8.3)

## 2013-02-25 LAB — SURGICAL PCR SCREEN
MRSA, PCR: POSITIVE — AB
Staphylococcus aureus: POSITIVE — AB

## 2013-02-25 LAB — LIPASE, BLOOD: Lipase: 400 U/L — ABNORMAL HIGH (ref 11–59)

## 2013-02-25 MED ORDER — MUPIROCIN 2 % EX OINT
1.0000 "application " | TOPICAL_OINTMENT | Freq: Two times a day (BID) | CUTANEOUS | Status: DC
  Administered 2013-02-25 – 2013-02-27 (×5): 1 via NASAL
  Filled 2013-02-25 (×2): qty 22

## 2013-02-25 MED ORDER — CIPROFLOXACIN IN D5W 400 MG/200ML IV SOLN
400.0000 mg | INTRAVENOUS | Status: AC
  Administered 2013-02-26: 400 mg via INTRAVENOUS
  Filled 2013-02-25: qty 200

## 2013-02-25 MED ORDER — CHLORHEXIDINE GLUCONATE CLOTH 2 % EX PADS
6.0000 | MEDICATED_PAD | Freq: Every day | CUTANEOUS | Status: DC
  Administered 2013-02-26 – 2013-02-28 (×3): 6 via TOPICAL

## 2013-02-25 MED ORDER — HEPARIN SODIUM (PORCINE) 5000 UNIT/ML IJ SOLN
5000.0000 [IU] | Freq: Three times a day (TID) | INTRAMUSCULAR | Status: DC
  Administered 2013-02-25 – 2013-02-26 (×3): 5000 [IU] via SUBCUTANEOUS
  Filled 2013-02-25 (×3): qty 1

## 2013-02-25 MED ORDER — HEPARIN SODIUM (PORCINE) 5000 UNIT/ML IJ SOLN
5000.0000 [IU] | Freq: Three times a day (TID) | INTRAMUSCULAR | Status: DC
  Filled 2013-02-25 (×3): qty 1

## 2013-02-25 MED ORDER — ACETAMINOPHEN 650 MG RE SUPP: 650.0000 mg | Freq: Four times a day (QID) | RECTAL | Status: DC | PRN

## 2013-02-25 MED ORDER — MORPHINE SULFATE 2 MG/ML IJ SOLN
2.0000 mg | INTRAMUSCULAR | Status: DC | PRN
  Administered 2013-02-25 – 2013-02-27 (×3): 2 mg via INTRAVENOUS
  Filled 2013-02-25 (×3): qty 1

## 2013-02-25 MED ORDER — PANTOPRAZOLE SODIUM 40 MG IV SOLR
40.0000 mg | Freq: Every day | INTRAVENOUS | Status: DC
  Administered 2013-02-25 – 2013-02-27 (×4): 40 mg via INTRAVENOUS
  Filled 2013-02-25 (×5): qty 40

## 2013-02-25 MED ORDER — ACETAMINOPHEN 325 MG PO TABS
650.0000 mg | ORAL_TABLET | Freq: Four times a day (QID) | ORAL | Status: DC | PRN
  Administered 2013-02-27: 650 mg via ORAL
  Filled 2013-02-25: qty 2

## 2013-02-25 MED ORDER — ONDANSETRON HCL 4 MG/2ML IJ SOLN: 4.0000 mg | Freq: Four times a day (QID) | INTRAMUSCULAR | Status: DC | PRN

## 2013-02-25 MED ORDER — SODIUM CHLORIDE 0.9 % IV SOLN
INTRAVENOUS | Status: DC
  Administered 2013-02-25: 100 mL/h via INTRAVENOUS
  Administered 2013-02-25 – 2013-02-27 (×5): via INTRAVENOUS

## 2013-02-25 NOTE — Progress Notes (Signed)
Will need lap chole IOC in next 24 - 48 hours if pancreatitis improves.

## 2013-02-25 NOTE — Progress Notes (Signed)
Patient ID: Adam Rosario, male   DOB: [birth year], 33 y.o.   MRN: [id number]    Subjective: Pt feels ok today.  Some better.  No nausea.  Objective: Vital signs in last 24 hours: Temp:  [98.1 F (36.7 C)-98.9 F (37.2 C)] 98.4 F (36.9 C) (01/13 0555) Pulse Rate:  [59-68] 65 (01/13 0555) Resp:  [13-23] 16 (01/13 0555) BP: (121-145)/(58-99) 127/69 mmHg (01/13 0555) SpO2:  [96 %-98 %] 96 % (01/13 0555) Weight:  [373 lb 10.9 oz (169.5 kg)-375 lb 8 oz (170.326 kg)] 373 lb 10.9 oz (169.5 kg) (01/12 2320) Last BM Date: 02/24/13  Intake/Output from previous day: 01/12 0701 - 01/13 0700 In: 338.3 [I.V.:338.3] Out: -  Intake/Output this shift:    PE: Abd: morbidly obese, some RUQ tenderness, +BS Heart: regular Lungs: CTAB  Lab Results:   Recent Labs  02/24/13 1617 02/25/13 0520  WBC 4.9 5.3  HGB 13.8 13.0  HCT 41.3 38.7*  PLT 305 301   BMET  Recent Labs  02/24/13 1617 02/25/13 0520  NA 141 141  K 3.9 4.2  CL 103 102  CO2 26 25  GLUCOSE 103* 88  BUN 7 7  CREATININE 0.60 0.67  CALCIUM 9.1 8.9   PT/INR No results found for this basename: LABPROT, INR,  in the last 72 hours CMP     Component Value Date/Time   NA 141 02/25/2013 0520   K 4.2 02/25/2013 0520   CL 102 02/25/2013 0520   CO2 25 02/25/2013 0520   GLUCOSE 88 02/25/2013 0520   BUN 7 02/25/2013 0520   CREATININE 0.67 02/25/2013 0520   CALCIUM 8.9 02/25/2013 0520   PROT 6.9 02/25/2013 0520   ALBUMIN 3.3* 02/25/2013 0520   AST 144* 02/25/2013 0520   ALT 264* 02/25/2013 0520   ALKPHOS 291* 02/25/2013 0520   BILITOT 1.9* 02/25/2013 0520   GFRNONAA >90 02/25/2013 0520   GFRAA >90 02/25/2013 0520   Lipase     Component Value Date/Time   LIPASE 400* 02/25/2013 0520       Studies/Results: [place] Abdomen Limited  02/24/2013   CLINICAL DATA:  Right upper quadrant abdominal pain. Current history of cholelithiasis.  EXAM: US ABDOMEN LIMITED - RIGHT UPPER QUADRANT  COMPARISON:  Abdominal ultrasound 03/14/2012.   FINDINGS: Gallbladder  Multiple shadowing gallstones, the largest approximating 10 mm. Mild gallbladder wall thickening up to approximately 5 mm. No pericholecystic fluid. Positive sonographic Murphy sign according to the ultrasound technologist.  Common bile duct  Diameter: 5 mm.  Liver:  Diffusely increased and coarsened echotexture without focal hepatic parenchymal abnormality. Patent portal vein with hepatopetal flow.  IMPRESSION: 1. Cholelithiasis and sonographic findings which are consistent with acute cholecystitis (gallbladder wall thickening and positive sonographic Murphy sign). 2. No biliary ductal dilation. 3. Diffuse hepatic steatosis and/or hepatocellular disease without focal hepatic parenchymal abnormality.   Electronically Signed   By: Hulan Saashomas  Lawrence M.D.   On: 02/24/2013 22:21    Anti-infectives: Anti-infectives   None       Assessment/Plan  1. Gallstone pancreatitis 2. Morbid obesity  Plan: 1. Will continue to treat pancreatitis conservatively today.  If continues to improve will plan for lap chole tomorrow. 2. May have clears today, given minimal nausea and pain   LOS: 1 day    Alvan Culpepper E 02/25/2013, 9:42 AM Pager: [phone]

## 2013-02-26 MED ORDER — HEPARIN SODIUM (PORCINE) 5000 UNIT/ML IJ SOLN
5000.0000 [IU] | Freq: Three times a day (TID) | INTRAMUSCULAR | Status: AC
  Administered 2013-02-26 (×2): 5000 [IU] via SUBCUTANEOUS

## 2013-02-26 NOTE — Progress Notes (Signed)
Subjective: Pt feels good, no pain.  Ambulating well, tolerating clears.  Disappointed he cant go to OR today.    Objective: Vital signs in last 24 hours: Temp:  [97.5 F (36.4 C)-98.2 F (36.8 C)] 97.7 F (36.5 C) (01/14 0552) Pulse Rate:  [56-75] 56 (01/14 0552) Resp:  [17-19] 19 (01/14 0552) BP: (121-137)/(63-74) 137/72 mmHg (01/14 0552) SpO2:  [96 %-100 %] 100 % (01/14 0552) Last BM Date: 02/25/13  Intake/Output from previous day: 01/13 0701 - 01/14 0700 In: 2882 [I.V.:2882] Out: -  Intake/Output this shift:    PE: Gen:  Alert, NAD, pleasant Abd: Obese, soft, NT/ND, +BS, no HSM   Lab Results:   Recent Labs  02/24/13 1617 02/25/13 0520  WBC 4.9 5.3  HGB 13.8 13.0  HCT 41.3 38.7*  PLT 305 301   BMET  Recent Labs  02/24/13 1617 02/25/13 0520  NA 141 141  K 3.9 4.2  CL 103 102  CO2 26 25  GLUCOSE 103* 88  BUN 7 7  CREATININE 0.60 0.67  CALCIUM 9.1 8.9   PT/INR No results found for this basename: LABPROT, INR,  in the last 72 hours CMP     Component Value Date/Time   NA 141 02/25/2013 0520   K 4.2 02/25/2013 0520   CL 102 02/25/2013 0520   CO2 25 02/25/2013 0520   GLUCOSE 88 02/25/2013 0520   BUN 7 02/25/2013 0520   CREATININE 0.67 02/25/2013 0520   CALCIUM 8.9 02/25/2013 0520   PROT 6.9 02/25/2013 0520   ALBUMIN 3.3* 02/25/2013 0520   AST 144* 02/25/2013 0520   ALT 264* 02/25/2013 0520   ALKPHOS 291* 02/25/2013 0520   BILITOT 1.9* 02/25/2013 0520   GFRNONAA >90 02/25/2013 0520   GFRAA >90 02/25/2013 0520   Lipase     Component Value Date/Time   LIPASE 400* 02/25/2013 0520       Studies/Results: [place] Abdomen Limited  02/24/2013   CLINICAL DATA:  Right upper quadrant abdominal pain. Current history of cholelithiasis.  EXAM: US ABDOMEN LIMITED - RIGHT UPPER QUADRANT  COMPARISON:  Abdominal ultrasound 03/14/2012.  FINDINGS: Gallbladder  Multiple shadowing gallstones, the largest approximating 10 mm. Mild gallbladder wall thickening up to  approximately 5 mm. No pericholecystic fluid. Positive sonographic Murphy sign according to the ultrasound technologist.  Common bile duct  Diameter: 5 mm.  Liver:  Diffusely increased and coarsened echotexture without focal hepatic parenchymal abnormality. Patent portal vein with hepatopetal flow.  IMPRESSION: 1. Cholelithiasis and sonographic findings which are consistent with acute cholecystitis (gallbladder wall thickening and positive sonographic Murphy sign). 2. No biliary ductal dilation. 3. Diffuse hepatic steatosis and/or hepatocellular disease without focal hepatic parenchymal abnormality.   Electronically Signed   By: Thomas  Lawrence M.D.   On: 02/24/2013 22:21    Anti-infectives: Anti-infectives   Start     Dose/Rate Route Frequency Ordered Stop   02/26/13 0600  ciprofloxacin (CIPRO) IVPB 400 mg     400 mg 200 mL/hr over 60 Minutes Intravenous On call to O.R. 02/25/13 0948 02/26/13 0627       Assessment/Plan 1. Gallstone pancreatitis  2. Morbid obesity   Plan:  1. Will continue to treat pancreatitis conservatively today Lipase still 400. Plan for OR tomorrow for lap chole 2. May have clears today, given minimal nausea and pain  3. Ambulate and IS 4. SCD's and heparin (hold after midnight)      LOS: 2 days    Adam Rosario, Adam Rosario 02/26/2013, 9:28 AM Pager: [phone]   

## 2013-02-26 NOTE — Progress Notes (Signed)
Lipase still up but clinically seems better.  Plan for or Thursday.

## 2013-02-27 ENCOUNTER — Encounter (HOSPITAL_COMMUNITY): Admission: EM | Disposition: A | Discharge status: Home / Self Care | Payer: Self-pay | Source: Home / Self Care

## 2013-02-27 ENCOUNTER — Encounter (HOSPITAL_COMMUNITY): Payer: Self-pay | Admitting: Certified Registered"

## 2013-02-27 ENCOUNTER — Encounter (HOSPITAL_COMMUNITY): Payer: 59 | Admitting: Anesthesiology

## 2013-02-27 ENCOUNTER — Inpatient Hospital Stay (HOSPITAL_COMMUNITY): Payer: 59 | Admitting: Anesthesiology

## 2013-02-27 ENCOUNTER — Inpatient Hospital Stay (HOSPITAL_COMMUNITY): Payer: 59

## 2013-02-27 DIAGNOSIS — K8010 Calculus of gallbladder with chronic cholecystitis without obstruction: Secondary | ICD-10-CM

## 2013-02-27 HISTORY — PX: CHOLECYSTECTOMY: SHX55

## 2013-02-27 LAB — COMPREHENSIVE METABOLIC PANEL
ALT: 264 U/L — ABNORMAL HIGH (ref 0–53)
AST: 170 U/L — ABNORMAL HIGH (ref 0–37)
Albumin: 3.1 g/dL — ABNORMAL LOW (ref 3.5–5.2)
Alkaline Phosphatase: 223 U/L — ABNORMAL HIGH (ref 39–117)
BILIRUBIN TOTAL: 1 mg/dL (ref 0.3–1.2)
BUN: 5 mg/dL — AB (ref 6–23)
CHLORIDE: 104 meq/L (ref 96–112)
CO2: 24 meq/L (ref 19–32)
Calcium: 9 mg/dL (ref 8.4–10.5)
Creatinine, Ser: 0.81 mg/dL (ref 0.50–1.35)
GFR calc Af Amer: >90 mL/min (ref >90)
GLUCOSE: 88 mg/dL (ref 70–99)
Potassium: 3.8 mEq/L (ref 3.7–5.3)
Sodium: 142 mEq/L (ref 137–147)
Total Protein: 6.4 g/dL (ref 6.0–8.3)

## 2013-02-27 LAB — LIPASE, BLOOD: LIPASE: 89 U/L — AB (ref 11–59)

## 2013-02-27 SURGERY — LAPAROSCOPIC CHOLECYSTECTOMY WITH INTRAOPERATIVE CHOLANGIOGRAM
Anesthesia: General | Site: Abdomen

## 2013-02-27 MED ORDER — CEFAZOLIN SODIUM 1-5 GM-% IV SOLN
INTRAVENOUS | Status: AC
  Filled 2013-02-27: qty 50

## 2013-02-27 MED ORDER — ONDANSETRON HCL 4 MG/2ML IJ SOLN: 4.0000 mg | Freq: Once | INTRAMUSCULAR | Status: DC | PRN

## 2013-02-27 MED ORDER — HYDROMORPHONE HCL PF 1 MG/ML IJ SOLN
0.2500 mg | INTRAMUSCULAR | Status: DC | PRN
  Administered 2013-02-27 (×3): 0.5 mg via INTRAVENOUS

## 2013-02-27 MED ORDER — LACTATED RINGERS IV SOLN
INTRAVENOUS | Status: DC
  Administered 2013-02-27 (×3): via INTRAVENOUS

## 2013-02-27 MED ORDER — BUPIVACAINE-EPINEPHRINE 0.25% -1:200000 IJ SOLN
INTRAMUSCULAR | Status: DC | PRN
  Administered 2013-02-27: 9 mL

## 2013-02-27 MED ORDER — SODIUM CHLORIDE 0.9 % IV SOLN
INTRAVENOUS | Status: DC | PRN
  Administered 2013-02-27: 10:00:00

## 2013-02-27 MED ORDER — PROPOFOL 10 MG/ML IV BOLUS
INTRAVENOUS | Status: DC | PRN
  Administered 2013-02-27: 400 mg via INTRAVENOUS

## 2013-02-27 MED ORDER — HYDRALAZINE HCL 20 MG/ML IJ SOLN
INTRAMUSCULAR | Status: DC | PRN
  Administered 2013-02-27 (×2): 5 mg via INTRAVENOUS

## 2013-02-27 MED ORDER — DEXTROSE 5 % IV SOLN
3.0000 g | Freq: Once | INTRAVENOUS | Status: DC
  Filled 2013-02-27: qty 3000

## 2013-02-27 MED ORDER — BUPIVACAINE-EPINEPHRINE (PF) 0.25% -1:200000 IJ SOLN
INTRAMUSCULAR | Status: AC
  Filled 2013-02-27: qty 30

## 2013-02-27 MED ORDER — LABETALOL HCL 5 MG/ML IV SOLN
INTRAVENOUS | Status: DC | PRN
  Administered 2013-02-27 (×4): 5 mg via INTRAVENOUS

## 2013-02-27 MED ORDER — LIDOCAINE HCL (CARDIAC) 20 MG/ML IV SOLN
INTRAVENOUS | Status: DC | PRN
  Administered 2013-02-27: 180 mg via INTRAVENOUS

## 2013-02-27 MED ORDER — NEOSTIGMINE METHYLSULFATE 1 MG/ML IJ SOLN
INTRAMUSCULAR | Status: DC | PRN
  Administered 2013-02-27: 4 mg via INTRAVENOUS

## 2013-02-27 MED ORDER — GLYCOPYRROLATE 0.2 MG/ML IJ SOLN
INTRAMUSCULAR | Status: DC | PRN
  Administered 2013-02-27: .8 mg via INTRAVENOUS

## 2013-02-27 MED ORDER — OXYCODONE HCL 5 MG/5ML PO SOLN: 5.0000 mg | Freq: Once | ORAL | Status: AC | PRN

## 2013-02-27 MED ORDER — HYDROMORPHONE HCL PF 1 MG/ML IJ SOLN
INTRAMUSCULAR | Status: AC
  Filled 2013-02-27: qty 1

## 2013-02-27 MED ORDER — 0.9 % SODIUM CHLORIDE (POUR BTL) OPTIME
TOPICAL | Status: DC | PRN
  Administered 2013-02-27: 1000 mL

## 2013-02-27 MED ORDER — OXYCODONE-ACETAMINOPHEN 5-325 MG PO TABS
1.0000 | ORAL_TABLET | ORAL | Status: DC | PRN
  Administered 2013-02-27 (×2): 2 via ORAL
  Filled 2013-02-27 (×2): qty 2

## 2013-02-27 MED ORDER — MIDAZOLAM HCL 5 MG/5ML IJ SOLN
INTRAMUSCULAR | Status: DC | PRN
  Administered 2013-02-27 (×2): 1 mg via INTRAVENOUS

## 2013-02-27 MED ORDER — DEXTROSE 5 % IV SOLN
3.0000 g | INTRAVENOUS | Status: DC | PRN
  Administered 2013-02-27: 3 g via INTRAVENOUS

## 2013-02-27 MED ORDER — OXYCODONE HCL 5 MG PO TABS
5.0000 mg | ORAL_TABLET | Freq: Once | ORAL | Status: AC | PRN
  Administered 2013-02-27: 5 mg via ORAL

## 2013-02-27 MED ORDER — CEFAZOLIN SODIUM-DEXTROSE 2-3 GM-% IV SOLR
INTRAVENOUS | Status: AC
  Filled 2013-02-27: qty 50

## 2013-02-27 MED ORDER — ROCURONIUM BROMIDE 100 MG/10ML IV SOLN
INTRAVENOUS | Status: DC | PRN
  Administered 2013-02-27: 10 mg via INTRAVENOUS
  Administered 2013-02-27: 50 mg via INTRAVENOUS

## 2013-02-27 MED ORDER — SODIUM CHLORIDE 0.9 % IR SOLN
Status: DC | PRN
  Administered 2013-02-27: 1000 mL

## 2013-02-27 MED ORDER — ONDANSETRON HCL 4 MG/2ML IJ SOLN
INTRAMUSCULAR | Status: DC | PRN
  Administered 2013-02-27: 4 mg via INTRAVENOUS

## 2013-02-27 MED ORDER — FENTANYL CITRATE 0.05 MG/ML IJ SOLN
INTRAMUSCULAR | Status: DC | PRN
  Administered 2013-02-27 (×4): 50 ug via INTRAVENOUS
  Administered 2013-02-27: 100 ug via INTRAVENOUS
  Administered 2013-02-27 (×2): 50 ug via INTRAVENOUS
  Administered 2013-02-27: 100 ug via INTRAVENOUS

## 2013-02-27 MED ORDER — OXYCODONE HCL 5 MG PO TABS
ORAL_TABLET | ORAL | Status: AC
  Filled 2013-02-27: qty 1

## 2013-02-27 MED ORDER — SODIUM CHLORIDE 0.9 % IV SOLN
INTRAVENOUS | Status: DC
  Administered 2013-02-28: 04:00:00 via INTRAVENOUS

## 2013-02-27 MED ORDER — HYDROMORPHONE HCL PF 1 MG/ML IJ SOLN: 0.5000 mg | INTRAMUSCULAR | Status: DC | PRN

## 2013-02-27 SURGICAL SUPPLY — 46 items
APPLIER CLIP ROT 10 11.4 M/L (STAPLE) ×4
BLADE SURG ROTATE 9660 (MISCELLANEOUS) ×2 IMPLANT
CANISTER SUCTION 2500CC (MISCELLANEOUS) ×2 IMPLANT
CHLORAPREP W/TINT 26ML (MISCELLANEOUS) ×4 IMPLANT
CLIP APPLIE ROT 10 11.4 M/L (STAPLE) ×2 IMPLANT
COVER MAYO STAND STRL (DRAPES) ×2 IMPLANT
COVER SURGICAL LIGHT HANDLE (MISCELLANEOUS) ×2 IMPLANT
DECANTER SPIKE VIAL GLASS SM (MISCELLANEOUS) ×4 IMPLANT
DERMABOND ADVANCED (GAUZE/BANDAGES/DRESSINGS) ×1
DERMABOND ADVANCED .7 DNX12 (GAUZE/BANDAGES/DRESSINGS) ×1 IMPLANT
DRAIN CHANNEL 19F RND (DRAIN) ×2 IMPLANT
DRAPE C-ARM 42X72 X-RAY (DRAPES) ×2 IMPLANT
DRAPE UTILITY 15X26 W/TAPE STR (DRAPE) ×4 IMPLANT
DRAPE WARM FLUID 44X44 (DRAPE) ×2 IMPLANT
ELECT REM PT RETURN 9FT ADLT (ELECTROSURGICAL) ×2
ELECTRODE REM PT RTRN 9FT ADLT (ELECTROSURGICAL) ×1 IMPLANT
ENDOLOOP SUT PDS II  0 18 (SUTURE) ×1
ENDOLOOP SUT PDS II 0 18 (SUTURE) ×1 IMPLANT
EVACUATOR SILICONE 100CC (DRAIN) ×2 IMPLANT
GLOVE BIO SURGEON STRL SZ8 (GLOVE) ×2 IMPLANT
GLOVE BIOGEL PI IND STRL 7.0 (GLOVE) ×1 IMPLANT
GLOVE BIOGEL PI IND STRL 8 (GLOVE) ×1 IMPLANT
GLOVE BIOGEL PI INDICATOR 7.0 (GLOVE) ×1
GLOVE BIOGEL PI INDICATOR 8 (GLOVE) ×1
GLOVE SURG SS PI 7.0 STRL IVOR (GLOVE) ×2 IMPLANT
GOWN STRL NON-REIN LRG LVL3 (GOWN DISPOSABLE) ×8 IMPLANT
GOWN STRL REIN XL XLG (GOWN DISPOSABLE) ×2 IMPLANT
KIT BASIN OR (CUSTOM PROCEDURE TRAY) ×2 IMPLANT
KIT ROOM TURNOVER OR (KITS) ×2 IMPLANT
NS IRRIG 1000ML POUR BTL (IV SOLUTION) ×4 IMPLANT
PAD ARMBOARD 7.5X6 YLW CONV (MISCELLANEOUS) ×2 IMPLANT
POUCH SPECIMEN RETRIEVAL 10MM (ENDOMECHANICALS) ×2 IMPLANT
SCISSORS LAP 5X35 DISP (ENDOMECHANICALS) ×2 IMPLANT
SET CHOLANGIOGRAPH 5 50 .035 (SET/KITS/TRAYS/PACK) ×2 IMPLANT
SET IRRIG TUBING LAPAROSCOPIC (IRRIGATION / IRRIGATOR) ×2 IMPLANT
SLEEVE ENDOPATH XCEL 5M (ENDOMECHANICALS) ×2 IMPLANT
SPECIMEN JAR SMALL (MISCELLANEOUS) ×2 IMPLANT
SPONGE GAUZE 4X4 12PLY (GAUZE/BANDAGES/DRESSINGS) ×2 IMPLANT
SUT ETHILON 2 0 FS 18 (SUTURE) ×2 IMPLANT
SUT MNCRL AB 4-0 PS2 18 (SUTURE) ×2 IMPLANT
TOWEL OR 17X24 6PK STRL BLUE (TOWEL DISPOSABLE) ×2 IMPLANT
TOWEL OR 17X26 10 PK STRL BLUE (TOWEL DISPOSABLE) ×2 IMPLANT
TRAY LAPAROSCOPIC (CUSTOM PROCEDURE TRAY) ×2 IMPLANT
TROCAR XCEL BLUNT TIP 100MML (ENDOMECHANICALS) ×2 IMPLANT
TROCAR XCEL NON-BLD 11X100MML (ENDOMECHANICALS) ×2 IMPLANT
TROCAR XCEL NON-BLD 5MMX100MML (ENDOMECHANICALS) ×2 IMPLANT

## 2013-02-27 NOTE — Anesthesia Procedure Notes (Signed)
Procedure Name: Intubation Date/Time: 02/27/2013 9:22 AM Performed by: Armandina GemmaMIRARCHI, Angie Piercey Pre-anesthesia Checklist: Patient identified, Timeout performed, Emergency Drugs available, Suction available and Patient being monitored Patient Re-evaluated:Patient Re-evaluated prior to inductionOxygen Delivery Method: Circle system utilized Intubation Type: IV induction Ventilation: Mask ventilation without difficulty Laryngoscope Size: Miller and 2 Grade View: Grade I Tube type: Oral Tube size: 7.5 mm Number of attempts: 1 Airway Equipment and Method: Stylet Placement Confirmation: ETT inserted through vocal cords under direct vision,  positive ETCO2 and breath sounds checked- equal and bilateral Secured at: 22 cm Tube secured with: Tape Dental Injury: Teeth and Oropharynx as per pre-operative assessment  Comments: IV induction Crews- slight chipped front tooth prior to intubation by AM CRNA- atraumatic teeth and mouth as preop

## 2013-02-27 NOTE — Transfer of Care (Signed)
Immediate Anesthesia Transfer of Care Note  Patient: Adam Rosario  Procedure(s) Performed: Procedure(s): LAPAROSCOPIC CHOLECYSTECTOMY WITH INTRAOPERATIVE CHOLANGIOGRAM (N/A)  Patient Location: PACU  Anesthesia Type:General  Level of Consciousness: awake and alert   Airway & Oxygen Therapy: Patient Spontanous Breathing and Patient connected to face mask oxygen  Post-op Assessment: Report given to PACU RN and Post -op Vital signs reviewed and stable  Post vital signs: Reviewed and stable  Complications: No apparent anesthesia complications

## 2013-02-27 NOTE — Op Note (Signed)
Laparoscopic Cholecystectomy with IOC Procedure Note  Indications: This patient presents with symptomatic gallbladder disease and will undergo laparoscopic cholecystectomy.Pt has resolving gallstone pancreatitis.  The procedure has been discussed with the patient. Operative and non operative treatments have been discussed. Risks of surgery include bleeding, infection,  Common bile duct injury,  Injury to the stomach,liver, colon,small intestine, abdominal wall,  Diaphragm,  Major blood vessels,  And the need for an open procedure.  Other risks include worsening of medical problems, death,  DVT and pulmonary embolism, and cardiovascular events.   Medical options have also been discussed. The patient has been informed of long term expectations of surgery and non surgical options,  The patient agrees to proceed.    Pre-operative Diagnosis: gallstone pancreatitis  Post-operative Diagnosis: Chronic cholecystitis and above  Surgeon: Maddisen Vought A.   Assistants: OR staff  Anesthesia: General endotracheal anesthesia and Local anesthesia 0.25.% bupivacaine, with epinephrine  ASA Class: 3  Procedure Details  The patient was seen again in the Holding Room. The risks, benefits, complications, treatment options, and expected outcomes were discussed with the patient. The possibilities of reaction to medication, pulmonary aspiration, perforation of viscus, bleeding, recurrent infection, finding a normal gallbladder, the need for additional procedures, failure to diagnose a condition, the possible need to convert to an open procedure, and creating a complication requiring transfusion or operation were discussed with the patient. The patient and/or family concurred with the proposed plan, giving informed consent. The site of surgery properly noted/marked. The patient was taken to Operating Room, identified as Adam Rosario and the procedure verified as Laparoscopic Cholecystectomy with Intraoperative  Cholangiograms. A Time Out was held and the above information confirmed.  Prior to the induction of general anesthesia, antibiotic prophylaxis was administered. General endotracheal anesthesia was then administered and tolerated well. After the induction, the abdomen was prepped in the usual sterile fashion. The patient was positioned in the supine position with the left arm comfortably tucked, along with some reverse Trendelenburg.  Local anesthetic agent was injected into the skin near the umbilicus and an incision made. The midline fascia was incised and the Hasson technique was used to introduce a 12 mm port under direct vision. It was secured with a figure of eight Vicryl suture placed in the usual fashion. Pneumoperitoneum was then created with CO2 and tolerated well without any adverse changes in the patient's vital signs. Additional trocars were introduced under direct vision with an 11 mm trocar in the epigastrium and two  5 mm trocars in the right upper quadrant. All skin incisions were infiltrated with a local anesthetic agent before making the incision and placing the trocars.   The gallbladder was identified, the fundus grasped and retracted cephalad. Adhesions were lysed bluntly and with the electrocautery where indicated, taking care not to injure any adjacent organs or viscus. The infundibulum was grasped and retracted laterally, exposing the peritoneum overlying the triangle of Calot. This was then divided and exposed in a blunt fashion. The cystic duct was clearly identified and bluntly dissected circumferentially. The junctions of the gallbladder, cystic duct and common bile duct were clearly identified prior to the division of any linear structure.   An incision was made in the cystic duct and the cholangiogram catheter introduced. The catheter was secured using an endoclip. The study showed no stones and good visualization of the distal and proximal biliary tree. The catheter was then  removed.   The cystic duct was then  ligated with surgical clips and Endoloop  on the patient side and  clipped on the gallbladder side and divided. The cystic artery was identified, dissected free, ligated with clips and divided as well. Posterior cystic artery clipped and divided. Given the large size of the cystic duct,  Endoloop used.  The gallbladder was dissected from the liver bed in retrograde fashion with the electrocautery. The gallbladder was removed and placed in the endocatch bag. . The liver bed was irrigated and inspected. Hemostasis was achieved with the electrocautery. Copious irrigation was utilized and was repeatedly aspirated until clear all particulate matter. Hemostasis was achieved with no signs  Of bleeding or bile leakage.A 19 F drain placed in the gallbladder fossa.   Pneumoperitoneum was completely reduced after viewing removal of the trocars under direct vision. The wound was thoroughly irrigated and the fascia was then closed with a figure of eight suture; the skin was then closed with 4 O monocryl  and a sterile dressing was applied.  Instrument, sponge, and needle counts were correct at closure and at the conclusion of the case.   Findings: Cholecystitis with Cholelithiasis   Estimated Blood Loss: Minimal         Drains: 19 F          Total IV Fluids: 400 mL         Specimens: Gallbladder           Complications: None; patient tolerated the procedure well.         Disposition: PACU - hemodynamically stable.         Condition: stable

## 2013-02-27 NOTE — Anesthesia Preprocedure Evaluation (Signed)
Anesthesia Evaluation  Patient identified by MRN, date of birth, ID band Patient awake    Reviewed: Allergy & Precautions, H&P , NPO status , Patient's Chart, lab work & pertinent test results  Airway Mallampati: I TM Distance: >3 FB Neck ROM: Full    Dental  (+) Teeth Intact and Dental Advisory Given   Pulmonary former smoker,  breath sounds clear to auscultation        Cardiovascular Rhythm:Regular Rate:Normal     Neuro/Psych    GI/Hepatic   Endo/Other  Morbid obesity  Renal/GU      Musculoskeletal   Abdominal   Peds  Hematology   Anesthesia Other Findings   Reproductive/Obstetrics                           Anesthesia Physical Anesthesia Plan  ASA: III  Anesthesia Plan: General   Post-op Pain Management:    Induction: Intravenous  Airway Management Planned: Oral ETT  Additional Equipment:   Intra-op Plan:   Post-operative Plan: Extubation in OR  Informed Consent: I have reviewed the patients History and Physical, chart, labs and discussed the procedure including the risks, benefits and alternatives for the proposed anesthesia with the patient or authorized representative who has indicated his/her understanding and acceptance.   Dental advisory given  Plan Discussed with: CRNA, Anesthesiologist and Surgeon  Anesthesia Plan Comments:         Anesthesia Quick Evaluation

## 2013-02-27 NOTE — Preoperative (Signed)
Beta Blockers   Reason not to administer Beta Blockers:Not Applicable 

## 2013-02-27 NOTE — H&P (View-Only) (Signed)
Lipase still up but clinically seems better.  Plan for or Thursday. 

## 2013-02-27 NOTE — Interval H&P Note (Signed)
History and Physical Interval Note:  02/27/2013 8:41 AM  Adam Rosario  has presented today for surgery, with the diagnosis of gallstones,pancreatitis  The various methods of treatment have been discussed with the patient and family. After consideration of risks, benefits and other options for treatment, the patient has consented to  Procedure(s): LAPAROSCOPIC CHOLECYSTECTOMY WITH INTRAOPERATIVE CHOLANGIOGRAM (N/A) as a surgical intervention .  The patient's history has been reviewed, patient examined, no change in status, stable for surgery.  I have reviewed the patient's chart and labs.  Questions were answered to the patient's satisfaction.     Zahra Peffley A.

## 2013-02-27 NOTE — H&P (View-Only) (Signed)
Subjective: Pt feels good, no pain.  Ambulating well, tolerating clears.  Disappointed he cant go to OR today.    Objective: Vital signs in last 24 hours: Temp:  [97.5 F (36.4 C)-98.2 F (36.8 C)] 97.7 F (36.5 C) (01/14 0552) Pulse Rate:  [56-75] 56 (01/14 0552) Resp:  [17-19] 19 (01/14 0552) BP: (121-137)/(63-74) 137/72 mmHg (01/14 0552) SpO2:  [96 %-100 %] 100 % (01/14 0552) Last BM Date: 02/25/13  Intake/Output from previous day: 01/13 0701 - 01/14 0700 In: 2882 [I.V.:2882] Out: -  Intake/Output this shift:    PE: Gen:  Alert, NAD, pleasant Abd: Obese, soft, NT/ND, +BS, no HSM   Lab Results:   Recent Labs  02/24/13 1617 02/25/13 0520  WBC 4.9 5.3  HGB 13.8 13.0  HCT 41.3 38.7*  PLT 305 301   BMET  Recent Labs  02/24/13 1617 02/25/13 0520  NA 141 141  K 3.9 4.2  CL 103 102  CO2 26 25  GLUCOSE 103* 88  BUN 7 7  CREATININE 0.60 0.67  CALCIUM 9.1 8.9   PT/INR No results found for this basename: LABPROT, INR,  in the last 72 hours CMP     Component Value Date/Time   NA 141 02/25/2013 0520   K 4.2 02/25/2013 0520   CL 102 02/25/2013 0520   CO2 25 02/25/2013 0520   GLUCOSE 88 02/25/2013 0520   BUN 7 02/25/2013 0520   CREATININE 0.67 02/25/2013 0520   CALCIUM 8.9 02/25/2013 0520   PROT 6.9 02/25/2013 0520   ALBUMIN 3.3* 02/25/2013 0520   AST 144* 02/25/2013 0520   ALT 264* 02/25/2013 0520   ALKPHOS 291* 02/25/2013 0520   BILITOT 1.9* 02/25/2013 0520   GFRNONAA >90 02/25/2013 0520   GFRAA >90 02/25/2013 0520   Lipase     Component Value Date/Time   LIPASE 400* 02/25/2013 0520       Studies/Results: [place] Abdomen Limited  02/24/2013   CLINICAL DATA:  Right upper quadrant abdominal pain. Current history of cholelithiasis.  EXAM: US ABDOMEN LIMITED - RIGHT UPPER QUADRANT  COMPARISON:  Abdominal ultrasound 03/14/2012.  FINDINGS: Gallbladder  Multiple shadowing gallstones, the largest approximating 10 mm. Mild gallbladder wall thickening up to  approximately 5 mm. No pericholecystic fluid. Positive sonographic Murphy sign according to the ultrasound technologist.  Common bile duct  Diameter: 5 mm.  Liver:  Diffusely increased and coarsened echotexture without focal hepatic parenchymal abnormality. Patent portal vein with hepatopetal flow.  IMPRESSION: 1. Cholelithiasis and sonographic findings which are consistent with acute cholecystitis (gallbladder wall thickening and positive sonographic Murphy sign). 2. No biliary ductal dilation. 3. Diffuse hepatic steatosis and/or hepatocellular disease without focal hepatic parenchymal abnormality.   Electronically Signed   By: Hulan Saashomas  Lawrence M.D.   On: 02/24/2013 22:21    Anti-infectives: Anti-infectives   Start     Dose/Rate Route Frequency Ordered Stop   02/26/13 0600  ciprofloxacin (CIPRO) IVPB 400 mg     400 mg 200 mL/hr over 60 Minutes Intravenous On call to O.R. 02/25/13 [id number] 02/26/13 0627       Assessment/Plan 1. Gallstone pancreatitis  2. Morbid obesity   Plan:  1. Will continue to treat pancreatitis conservatively today Lipase still 400. Plan for OR tomorrow for lap chole 2. May have clears today, given minimal nausea and pain  3. Ambulate and IS 4. SCD's and heparin (hold after midnight)      LOS: 2 days    DORT, Loxley Schmale 02/26/2013, 9:28 AM Pager: [phone]

## 2013-02-27 NOTE — Anesthesia Postprocedure Evaluation (Signed)
  Anesthesia Post-op Note  Patient: Adam Rosario  Procedure(s) Performed: Procedure(s): LAPAROSCOPIC CHOLECYSTECTOMY WITH INTRAOPERATIVE CHOLANGIOGRAM (N/A)  Patient Location: PACU  Anesthesia Type:General  Level of Consciousness: awake, alert  and oriented  Airway and Oxygen Therapy: Patient Spontanous Breathing  Post-op Pain: mild  Post-op Assessment: Post-op Vital signs reviewed  Post-op Vital Signs: Reviewed  Complications: No apparent anesthesia complications

## 2013-02-27 NOTE — Interval H&P Note (Signed)
History and Physical Interval Note:  02/27/2013 8:41 AM  Ned GraceJames L Lesmeister  has presented today for surgery, with the diagnosis of gallstones,pancreatitis  The various methods of treatment have been discussed with the patient and family. After consideration of risks, benefits and other options for treatment, the patient has consented to  Procedure(s): LAPAROSCOPIC CHOLECYSTECTOMY WITH INTRAOPERATIVE CHOLANGIOGRAM (N/A) as a surgical intervention .  The patient's history has been reviewed, patient examined, no change in status, stable for surgery.  I have reviewed the patient's chart and labs.  Questions were answered to the patient's satisfaction.  The procedure has been discussed with the patient. Operative and non operative treatments have been discussed. Risks of surgery include bleeding, infection,  Common bile duct injury,  Injury to the stomach,liver, colon,small intestine, abdominal wall,  Diaphragm,  Major blood vessels,  And the need for an open procedure.  Other risks include worsening of medical problems, death,  DVT and pulmonary embolism, and cardiovascular events.   Medical options have also been discussed. The patient has been informed of long term expectations of surgery and non surgical options,  The patient agrees to proceed.     Eugen Jeansonne A.

## 2013-02-28 ENCOUNTER — Encounter (HOSPITAL_COMMUNITY): Payer: Self-pay | Admitting: Surgery

## 2013-02-28 LAB — COMPREHENSIVE METABOLIC PANEL
ALK PHOS: 202 U/L — AB (ref 39–117)
ALT: 264 U/L — AB (ref 0–53)
AST: 170 U/L — AB (ref 0–37)
Albumin: 2.9 g/dL — ABNORMAL LOW (ref 3.5–5.2)
BUN: 4 mg/dL — ABNORMAL LOW (ref 6–23)
CO2: 26 mEq/L (ref 19–32)
Calcium: 8.9 mg/dL (ref 8.4–10.5)
Chloride: 102 mEq/L (ref 96–112)
Creatinine, Ser: 0.8 mg/dL (ref 0.50–1.35)
GFR calc non Af Amer: >90 mL/min (ref >90)
Glucose, Bld: 109 mg/dL — ABNORMAL HIGH (ref 70–99)
Potassium: 3.7 mEq/L (ref 3.7–5.3)
SODIUM: 140 meq/L (ref 137–147)
TOTAL PROTEIN: 6.4 g/dL (ref 6.0–8.3)
Total Bilirubin: 0.9 mg/dL (ref 0.3–1.2)

## 2013-02-28 LAB — LIPASE, BLOOD: LIPASE: 33 U/L (ref 11–59)

## 2013-02-28 MED ORDER — OXYCODONE-ACETAMINOPHEN 5-325 MG PO TABS: 1.0000 | ORAL_TABLET | ORAL | Status: DC | PRN

## 2013-02-28 MED ORDER — PANTOPRAZOLE SODIUM 40 MG PO TBEC: 40.0000 mg | DELAYED_RELEASE_TABLET | Freq: Every day | ORAL | Status: DC

## 2013-02-28 NOTE — Discharge Summary (Signed)
READY FOR DISCHARGE

## 2013-02-28 NOTE — Discharge Instructions (Signed)
[facility] ______[facility] °LAPAROSCOPIC SURGERY: POST OP INSTRUCTIONS °Always review your discharge instruction sheet given to you by the facility where your surgery was performed. °IF YOU HAVE DISABILITY OR FAMILY LEAVE FORMS, YOU MUST BRING THEM TO THE OFFICE FOR PROCESSING.   °DO NOT GIVE THEM TO YOUR DOCTOR. ° °1. A prescription for pain medication may be given to you upon discharge.  Take your pain medication as prescribed, if needed.  If narcotic pain medicine is not needed, then you may take acetaminophen (Tylenol) or ibuprofen (Advil) as needed. °2. Take your usually prescribed medications unless otherwise directed. °3. If you need a refill on your pain medication, please contact your pharmacy.  They will contact our office to request authorization. Prescriptions will not be filled after 5pm or on week-ends. °4. You should follow a light diet the first few days after arrival home, such as soup and crackers, etc.  Be sure to include lots of fluids daily. °5. Most patients will experience some swelling and bruising in the area of the incisions.  Ice packs will help.  Swelling and bruising can take several days to resolve.  °6. It is common to experience some constipation if taking pain medication after surgery.  Increasing fluid intake and taking a stool softener (such as Colace) will usually help or prevent this problem from occurring.  A mild laxative (Milk of Magnesia or Miralax) should be taken according to package instructions if there are no bowel movements after 48 hours. °7. Unless discharge instructions indicate otherwise, you may remove your bandages 24-48 hours after surgery, and you may shower at that time.  You may have steri-strips (small skin tapes) in place directly over the incision.  These strips should be left on the skin for 7-10 days.  If your surgeon used skin glue on the incision, you may shower in 24 hours.  The glue will flake off over the next 2-3 weeks.  Any sutures or  staples will be removed at the office during your follow-up visit. °8. ACTIVITIES:  You may resume regular (light) daily activities beginning the next day--such as daily self-care, walking, climbing stairs--gradually increasing activities as tolerated.  You may have sexual intercourse when it is comfortable.  Refrain from any heavy lifting or straining until approved by your doctor. °a. You may drive when you are no longer taking prescription pain medication, you can comfortably wear a seatbelt, and you can safely maneuver your car and apply brakes. °b. RETURN TO WORK:  __________________________________________________________ °9. You should see your doctor in the office for a follow-up appointment approximately 2-3 weeks after your surgery.  Make sure that you call for this appointment within a day or two after you arrive home to insure a convenient appointment time. °10. OTHER INSTRUCTIONS: __________________________________________________________________________________________________________________________ __________________________________________________________________________________________________________________________ °WHEN TO CALL YOUR DOCTOR: °1. Fever over 101.0 °2. Inability to urinate °3. Continued bleeding from incision. °4. Increased pain, redness, or drainage from the incision. °5. Increasing abdominal pain ° °The clinic staff is available to answer your questions during regular business hours.  Please don’t hesitate to call and ask to speak to one of the nurses for clinical concerns.  If you have a medical emergency, go to the nearest emergency room or call 911.  A surgeon from [facility] is always on call at the hospital. °[address], [address], [place], [place]  [address] ? [address] [address], [place],    [address] °[phone] ? [phone] ? FAX [phone] °Web site:   [email or web address] °

## 2013-02-28 NOTE — Discharge Summary (Signed)
Physician Discharge Summary  Patient ID: Adam Rosario MRN: [id number] DOB/AGE: 1980-05-07 32 y.o.  Admit date: 02/24/2013 Discharge date: 02/28/2013  Discharge Diagnoses Patient Active Problem List   Diagnosis Date Noted  . Pancreatitis due to biliary obstruction 02/24/2013  . Chronic cholecystitis with calculus 03/22/2012  . Morbid obesity with BMI of 45.0-49.9, adult 03/22/2012    Consultants None   Procedures Laparoscopic cholecystectomy by Dr. Harriette Bouillonhomas Cornett   HPI: [age] yom who is morbidly obese and saw Dr Corliss Skainssuei last year with symptomatic cholelithiasis. He decided not to undergo surgery at that time for financial reasons. Since then he has had some ruq pain intermittently after eating that goes away after a couple hours. The current episode has lasted two days and consists of nausea, loose stools, ruq and epigastric pain that is not resolving. Nothing at home will help. He has not been eating at all. He saw pcp today and was referred to er after undergoing lab evaluation. Workup was consistent with gallstone pancreatitis and he was admitted to the acute care general surgery service for laparoscopic cholecystectomy.   Hospital Course: The patient was able to undergo the procedure on hospital day #4 and it proceeded without complication. A drain was left in place that the patient will discharge with. Following surgery the patient was able to tolerate a diet and had minimal pain. He was discharged in improved condition.      Medication List         ALEVE 220 MG Caps  Generic drug:  Naproxen Sodium  Take 220 mg by mouth every 8 (eight) hours as needed (pain).     oxyCODONE-acetaminophen 5-325 MG per tablet  Commonly known as:  PERCOCET/ROXICET  Take 1-2 tablets by mouth every 4 (four) hours as needed for moderate pain.     pantoprazole 20 MG tablet  Commonly known as:  PROTONIX  Take 1 tablet (20 mg total) by mouth 2 (two) times daily.             Follow-up  Information   Follow up with Harriette BouillonORNETT,THOMAS A., MD. (Office will call you with appointment)    Specialty:  General Surgery   Contact information:   [address] [place] [address] [phone]       Signed: Freeman CaldronMichael J. Aziel Morgan, PA-C Pager: [phone] General Trauma PA Pager: [phone]  02/28/2013, 10:11 AM

## 2013-02-28 NOTE — Progress Notes (Addendum)
DC HOME WITH GIRLFRIEND,VERBALLY UNDERSTOOD DC INSTRUCTIONS, NO QUESTIONS ASK, INSTRUCTED ON JP CARE(SUPPLIES SENT WITH PT),ALSO INSTRUCTED TO CONTINUE BACTROBAN IN NARES X3 DAYS BID.

## 2013-02-28 NOTE — Progress Notes (Signed)
Keep drain for now.  Stump difficult to close.

## 2013-02-28 NOTE — Progress Notes (Signed)
Patient ID: Adam Rosario, male   DOB: [birth year], 33 y.o.   MRN: [id number]   LOS: 4 days   Subjective: Feeling better, sore but not much pain. Denies N/V.   Objective: Vital signs in last 24 hours: Temp:  [97.7 F (36.5 C)-99 F (37.2 C)] 98.5 F (36.9 C) (01/16 0947) Pulse Rate:  [71-95] 80 (01/16 0947) Resp:  [15-22] 18 (01/16 0947) BP: (123-146)/(65-89) 132/67 mmHg (01/16 0947) SpO2:  [90 %-100 %] 97 % (01/16 0947) Last BM Date: 02/27/13   JP: 1520ml/24h   Laboratory  BMET  Recent Labs  02/27/13 0410 02/28/13 0250  NA 142 140  K 3.8 3.7  CL 104 102  CO2 24 26  GLUCOSE 88 109*  BUN 5* 4*  CREATININE 0.81 0.80  CALCIUM 9.0 8.9   Lab Results  Component Value Date   ALT 264* 02/28/2013   AST 170* 02/28/2013   ALKPHOS 202* 02/28/2013   BILITOT 0.9 02/28/2013    Physical Exam General appearance: alert and no distress Resp: clear to auscultation bilaterally Cardio: regular rate and rhythm GI: Soft, +BS, incisions C/D/I, JP in place   Assessment/Plan: GS panc s/p lap chole -- D/C home with drain   Freeman CaldronMichael J. Murl Zogg, PA-C Pager: [phone] General Trauma PA Pager: [phone]   02/28/2013

## 2013-03-04 ENCOUNTER — Telehealth (INDEPENDENT_AMBULATORY_CARE_PROVIDER_SITE_OTHER): Payer: Self-pay

## 2013-03-04 NOTE — Telephone Encounter (Signed)
Message copied by Brennan BaileyBROOKS, Damani Kelemen on Tue Mar 04, 2013  1:48 PM ------      Message from: Wilder GladeMCPHERSON, ANNIE      Created: Fri Feb 28, 2013 10:42 AM                   ----- Message -----         From: Freeman CaldronMichael J. Jeffery, PA-C         Sent: 02/28/2013  10:17 AM           To: [facility] Clinical Pool            This patient needs an appointment with Dr. Luisa Hartornett in 2-3 weeks s/p lap chole with drain in place. ------

## 2013-03-04 NOTE — Telephone Encounter (Signed)
Called pt with appt info 

## 2013-03-06 ENCOUNTER — Telehealth (INDEPENDENT_AMBULATORY_CARE_PROVIDER_SITE_OTHER): Payer: Self-pay | Admitting: General Surgery

## 2013-03-06 NOTE — Telephone Encounter (Signed)
Pt notified to come in to office tomorrow to let Dr. Luisa Hartornett assess his JP drain.  [facility] that it is not draining and was painful.

## 2013-03-07 ENCOUNTER — Encounter (INDEPENDENT_AMBULATORY_CARE_PROVIDER_SITE_OTHER): Payer: Self-pay | Admitting: Surgery

## 2013-03-07 ENCOUNTER — Ambulatory Visit (INDEPENDENT_AMBULATORY_CARE_PROVIDER_SITE_OTHER): Payer: 59 | Admitting: Surgery

## 2013-03-07 VITALS — BP 134/78 | HR 80 | Temp 98.0°F | Resp 15 | Ht 73.0 in | Wt 376.6 lb

## 2013-03-07 DIAGNOSIS — T8140XA Infection following a procedure, unspecified, initial encounter: Secondary | ICD-10-CM

## 2013-03-07 DIAGNOSIS — T8149XA Infection following a procedure, other surgical site, initial encounter: Secondary | ICD-10-CM

## 2013-03-07 DIAGNOSIS — Y838 Other surgical procedures as the cause of abnormal reaction of the patient, or of later complication, without mention of misadventure at the time of the procedure: Secondary | ICD-10-CM

## 2013-03-07 NOTE — Progress Notes (Signed)
He is here for a postop visit following laparoscopic cholecystectomy.  Diet is being tolerated, bowels are moving.  No problems with incisions.  PE:  ABD:  Soft, incisions clean/dry/intact and solid. JP drain removed  Assessment:  Doing well postop.  Plan:  Lowfat diet recommended.  Activities as tolerated.  Return visit prn.

## 2013-03-07 NOTE — Patient Instructions (Signed)
Return as needed.  Return to work 1 week.  No restrictions

## 2013-03-10 ENCOUNTER — Encounter (INDEPENDENT_AMBULATORY_CARE_PROVIDER_SITE_OTHER): Payer: 59 | Admitting: Surgery

## 2013-03-14 ENCOUNTER — Encounter (INDEPENDENT_AMBULATORY_CARE_PROVIDER_SITE_OTHER): Payer: 59 | Admitting: Surgery

## 2013-03-21 ENCOUNTER — Encounter (INDEPENDENT_AMBULATORY_CARE_PROVIDER_SITE_OTHER): Payer: 59 | Admitting: Surgery

## 2014-02-27 ENCOUNTER — Other Ambulatory Visit: Payer: Self-pay | Admitting: Family Medicine

## 2014-02-27 DIAGNOSIS — R1084 Generalized abdominal pain: Secondary | ICD-10-CM

## 2014-03-06 ENCOUNTER — Other Ambulatory Visit: Payer: Self-pay

## 2014-03-19 ENCOUNTER — Other Ambulatory Visit: Payer: Self-pay

## 2014-03-24 ENCOUNTER — Ambulatory Visit
Admission: RE | Admit: 2014-03-24 | Discharge: 2014-03-24 | Disposition: A | Discharge status: Home / Self Care | Payer: 59 | Source: Ambulatory Visit | Attending: Family Medicine | Admitting: Family Medicine

## 2014-03-24 DIAGNOSIS — R1084 Generalized abdominal pain: Secondary | ICD-10-CM

## 2014-05-11 ENCOUNTER — Emergency Department (HOSPITAL_BASED_OUTPATIENT_CLINIC_OR_DEPARTMENT_OTHER)
Admission: EM | Admit: 2014-05-11 | Discharge: 2014-05-11 | Disposition: A | Discharge status: Home / Self Care | Payer: 59 | Attending: Emergency Medicine | Admitting: Emergency Medicine

## 2014-05-11 ENCOUNTER — Encounter (HOSPITAL_BASED_OUTPATIENT_CLINIC_OR_DEPARTMENT_OTHER): Payer: Self-pay | Admitting: *Deleted

## 2014-05-11 ENCOUNTER — Emergency Department (HOSPITAL_BASED_OUTPATIENT_CLINIC_OR_DEPARTMENT_OTHER): Payer: 59

## 2014-05-11 DIAGNOSIS — K429 Umbilical hernia without obstruction or gangrene: Secondary | ICD-10-CM | POA: Diagnosis not present

## 2014-05-11 DIAGNOSIS — R1030 Lower abdominal pain, unspecified: Secondary | ICD-10-CM | POA: Diagnosis present

## 2014-05-11 DIAGNOSIS — E669 Obesity, unspecified: Secondary | ICD-10-CM | POA: Insufficient documentation

## 2014-05-11 DIAGNOSIS — Z87891 Personal history of nicotine dependence: Secondary | ICD-10-CM | POA: Diagnosis not present

## 2014-05-11 DIAGNOSIS — Z791 Long term (current) use of non-steroidal anti-inflammatories (NSAID): Secondary | ICD-10-CM | POA: Insufficient documentation

## 2014-05-11 LAB — COMPREHENSIVE METABOLIC PANEL
ALBUMIN: 4 g/dL (ref 3.5–5.2)
ALK PHOS: 87 U/L (ref 39–117)
ALT: 23 U/L (ref 0–53)
ANION GAP: 6 (ref 5–15)
AST: 26 U/L (ref 0–37)
BUN: 10 mg/dL (ref 6–23)
CO2: 29 mmol/L (ref 19–32)
Calcium: 8.9 mg/dL (ref 8.4–10.5)
Chloride: 106 mmol/L (ref 96–112)
Creatinine, Ser: 0.75 mg/dL (ref 0.50–1.35)
GFR calc Af Amer: >90 mL/min (ref >90)
GFR calc non Af Amer: >90 mL/min (ref >90)
Glucose, Bld: 124 mg/dL — ABNORMAL HIGH (ref 70–99)
Potassium: 3.4 mmol/L — ABNORMAL LOW (ref 3.5–5.1)
SODIUM: 141 mmol/L (ref 135–145)
TOTAL PROTEIN: 7 g/dL (ref 6.0–8.3)
Total Bilirubin: 0.2 mg/dL — ABNORMAL LOW (ref 0.3–1.2)

## 2014-05-11 LAB — CBC WITH DIFFERENTIAL/PLATELET
Basophils Absolute: 0 10*3/uL (ref 0.0–0.1)
Basophils Relative: 0 % (ref 0–1)
Eosinophils Absolute: 0.1 10*3/uL (ref 0.0–0.7)
Eosinophils Relative: 2 % (ref 0–5)
HCT: 41.6 % (ref 39.0–52.0)
Hemoglobin: 13.7 g/dL (ref 13.0–17.0)
LYMPHS ABS: 2.3 10*3/uL (ref 0.7–4.0)
LYMPHS PCT: 41 % (ref 12–46)
MCH: 27.5 pg (ref 26.0–34.0)
MCHC: 32.9 g/dL (ref 30.0–36.0)
MCV: 83.5 fL (ref 78.0–100.0)
Monocytes Absolute: 0.4 10*3/uL (ref 0.1–1.0)
Monocytes Relative: 8 % (ref 3–12)
NEUTROS ABS: 2.8 10*3/uL (ref 1.7–7.7)
NEUTROS PCT: 49 % (ref 43–77)
PLATELETS: 325 10*3/uL (ref 150–400)
RBC: 4.98 MIL/uL (ref 4.22–5.81)
RDW: 14 % (ref 11.5–15.5)
WBC: 5.7 10*3/uL (ref 4.0–10.5)

## 2014-05-11 LAB — URINALYSIS, ROUTINE W REFLEX MICROSCOPIC
Bilirubin Urine: NEGATIVE
GLUCOSE, UA: NEGATIVE mg/dL
Hgb urine dipstick: NEGATIVE
Ketones, ur: NEGATIVE mg/dL
Leukocytes, UA: NEGATIVE
NITRITE: NEGATIVE
PH: 7.5 (ref 5.0–8.0)
Protein, ur: NEGATIVE mg/dL
Urobilinogen, UA: 1 mg/dL (ref 0.0–1.0)

## 2014-05-11 MED ORDER — IBUPROFEN 800 MG PO TABS: 800.0000 mg | ORAL_TABLET | Freq: Three times a day (TID) | ORAL | Status: DC | PRN

## 2014-05-11 MED ORDER — IOHEXOL 300 MG/ML  SOLN
25.0000 mL | Freq: Once | INTRAMUSCULAR | Status: AC | PRN
  Administered 2014-05-11: 25 mL via ORAL

## 2014-05-11 MED ORDER — IOHEXOL 300 MG/ML  SOLN
100.0000 mL | Freq: Once | INTRAMUSCULAR | Status: AC | PRN
  Administered 2014-05-11: 100 mL via INTRAVENOUS

## 2014-05-11 NOTE — Discharge Instructions (Signed)
Muscle Strain A muscle strain is an injury that occurs when a muscle is stretched beyond its normal length. Usually a small number of muscle fibers are torn when this happens. Muscle strain is rated in degrees. First-degree strains have the least amount of muscle fiber tearing and pain. Second-degree and third-degree strains have increasingly more tearing and pain.  Usually, recovery from muscle strain takes 1-2 weeks. Complete healing takes 5-6 weeks.  CAUSES  Muscle strain happens when a sudden, violent force placed on a muscle stretches it too far. This may occur with lifting, sports, or a fall.  RISK FACTORS Muscle strain is especially common in athletes.  SIGNS AND SYMPTOMS At the site of the muscle strain, there may be:  Pain.  Bruising.  Swelling.  Difficulty using the muscle due to pain or lack of normal function. DIAGNOSIS  Your health care provider will perform a physical exam and ask about your medical history. TREATMENT  Often, the best treatment for a muscle strain is resting, icing, and applying cold compresses to the injured area.  HOME CARE INSTRUCTIONS   Use the PRICE method of treatment to promote muscle healing during the first 2-3 days after your injury. The PRICE method involves:  Protecting the muscle from being injured again.  Restricting your activity and resting the injured body part.  Icing your injury. To do this, put ice in a plastic bag. Place a towel between your skin and the bag. Then, apply the ice and leave it on from 15-20 minutes each hour. After the third day, switch to moist heat packs.  Apply compression to the injured area with a splint or elastic bandage. Be careful not to wrap it too tightly. This may interfere with blood circulation or increase swelling.  Elevate the injured body part above the level of your heart as often as you can.  Only take over-the-counter or prescription medicines for pain, discomfort, or fever as directed by your  health care provider.  Warming up prior to exercise helps to prevent future muscle strains. SEEK MEDICAL CARE IF:   You have increasing pain or swelling in the injured area.  You have numbness, tingling, or a significant loss of strength in the injured area. MAKE SURE YOU:   Understand these instructions.  Will watch your condition.  Will get help right away if you are not doing well or get worse. Document Released: 01/30/2005 Document Revised: 11/20/2012 Document Reviewed: 08/29/2012 [facility] Patient Information 2015 [place], [place]. This information is not intended to replace advice given to you by your health care provider. Make sure you discuss any questions you have with your health care provider.   Hernia - you have a small fat-containing hernia around your bellybutton called an umbilical hernia that is not likely causing you any pain but was seen on your CT scan A hernia occurs when an internal organ pushes out through a weak spot in the abdominal wall. Hernias most commonly occur in the groin and around the navel. Hernias often can be pushed back into place (reduced). Most hernias tend to get worse over time. Some abdominal hernias can get stuck in the opening (irreducible or incarcerated hernia) and cannot be reduced. An irreducible abdominal hernia which is tightly squeezed into the opening is at risk for impaired blood supply (strangulated hernia). A strangulated hernia is a medical emergency. Because of the risk for an irreducible or strangulated hernia, surgery may be recommended to repair a hernia. CAUSES   Heavy lifting.  Prolonged  coughing.  Straining to have a bowel movement.  A cut (incision) made during an abdominal surgery. HOME CARE INSTRUCTIONS   Bed rest is not required. You may continue your normal activities.  Avoid lifting more than 10 pounds (4.5 kg) or straining.  Cough gently. If you are a smoker it is best to stop. Even the best hernia repair can  break down with the continual strain of coughing. Even if you do not have your hernia repaired, a cough will continue to aggravate the problem.  Do not wear anything tight over your hernia. Do not try to keep it in with an outside bandage or truss. These can damage abdominal contents if they are trapped within the hernia sac.  Eat a normal diet.  Avoid constipation. Straining over long periods of time will increase hernia size and encourage breakdown of repairs. If you cannot do this with diet alone, stool softeners may be used. SEEK IMMEDIATE MEDICAL CARE IF:   You have a fever.  You develop increasing abdominal pain.  You feel nauseous or vomit.  Your hernia is stuck outside the abdomen, looks discolored, feels hard, or is tender.  You have any changes in your bowel habits or in the hernia that are unusual for you.  You have increased pain or swelling around the hernia.  You cannot push the hernia back in place by applying gentle pressure while lying down. MAKE SURE YOU:   Understand these instructions.  Will watch your condition.  Will get help right away if you are not doing well or get worse. Document Released: 01/30/2005 Document Revised: 04/24/2011 Document Reviewed: 09/19/2007 [facility] Patient Information 2015 [place]. This information is not intended to replace advice given to you by your health care provider. Make sure you discuss any questions you have with your health care provider.

## 2014-05-11 NOTE — ED Provider Notes (Signed)
TIME SEEN: 6:27PM  CHIEF COMPLAINT: Abdominal Pain   HPI Comments: Adam Rosario is a 34 y.o. male with a history of gallstones and cholecystitis status post cholecystectomy, pancreatitis who presents to the Emergency Department complaining of intermittent, lower abdominal pain for the last 3 months. Pt states that he has pain when he lifts things at works or when he bends down. He states he has not noticed any bulging in the area. Pt reports he experienced a sharp pain into his groin area when he lifting something yesterday. He reports he had a cholecystectomy in January of 2015. Pt reports that he has experienced regular diarrhea since that time. He denies fever, chills, penile discharge and testicle swelling. No dysuria or hematuria. No scrotal masses.  ROS: See HPI Constitutional: no fever  Eyes: no drainage  ENT: no runny nose   Cardiovascular:  no chest pain  Resp: no SOB  GI: no vomiting, +abdominal pain  GU: no dysuria Integumentary: no rash  Allergy: no hives  Musculoskeletal: no leg swelling  Neurological: no slurred speech ROS otherwise negative  PAST MEDICAL HISTORY/PAST SURGICAL HISTORY:  Past Medical History  Diagnosis Date  . Gallstones   . Pancreatitis   . Obesity   . Cholecystitis     MEDICATIONS:  Prior to Admission medications   Medication Sig Start Date End Date Taking? Authorizing Provider  Naproxen Sodium (ALEVE) 220 MG CAPS Take 220 mg by mouth every 8 (eight) hours as needed (pain).    Historical Provider, MD    ALLERGIES:  No Known Allergies  SOCIAL HISTORY:  History  Substance Use Topics  . Smoking status: Former Smoker -- 0.50 packs/day    Types: Cigarettes    Quit date: 02/17/2013  . Smokeless tobacco: Never Used  . Alcohol Use: Yes     Comment: occ    FAMILY HISTORY: No family history on file.  EXAM: BP 135/84 mmHg  Pulse 86  Temp(Src) 99.3 F (37.4 C) (Oral)  Resp 18  Ht  (1.88 m)  Wt 370 lb (167.831 kg)  BMI 47.49  kg/m2  SpO2 99% CONSTITUTIONAL: Alert and oriented and responds appropriately to questions. Well-appearing; well-nourished, morbidly obese HEAD: Normocephalic EYES: Conjunctivae clear, PERRL ENT: normal nose; no rhinorrhea; moist mucous membranes; pharynx without lesions noted NECK: Supple, no meningismus, no LAD  CARD: RRR; S1 and S2 appreciated; no murmurs, no clicks, no rubs, no gallops RESP: Normal chest excursion without splinting or tachypnea; breath sounds clear and equal bilaterally; no wheezes, no rhonchi, no rales,  ABD/GI: Normal bowel sounds; non-distended; soft, non-tender, no rebound, no guarding, patient is tender to palpation diffusely across the lower abdomen and underneath his penis without obvious skin lesions, exam is limited given his morbid obesity, I do not appreciate any masses, no peritoneal signs GU:  Circumcised male, normal penile shaft, no blood or discharge at the urethral meatus, normal urethral meatus, no testicular pain or masses, no scrotal swelling or masses, no peritoneal warmth or erythema or crepitus, 2+ femoral pulses bilaterally, no hernia appreciated with Valsalva BACK:  The back appears normal and is non-tender to palpation, there is no CVA tenderness EXT: Normal ROM in all joints; non-tender to palpation; no edema; normal capillary refill; no cyanosis    SKIN: Normal color for age and race; warm NEURO: Moves all extremities equally PSYCH: The patient's mood and manner are appropriate. Grooming and personal hygiene are appropriate.  MEDICAL DECISION MAKING: Patient here with lower abdominal pain for the past 3  weeks intermittently. Suspect possible hernia but given my exam is significantly limited due to his obesity will obtain labs, urine and a CT of his abdomen and pelvis. He declines pain medication at this time.    ED PROGRESS: Patient is still feeling well. Patient's labs are unremarkable. Urine shows no sign of infection. CT scan shows a small  umbilical hernia containing fat but no other acute abnormality. Suspect muscle strain. Have advised him to use ibuprofen as needed. We'll provide work note for the next couple of days and then light duty for the next week after that. Discussed return precautions. He verbalized understanding and is comfortable with plan.    I personally performed the services described in this documentation, which was scribed in my presence. The recorded information has been reviewed and is accurate.   Layla MawKristen N Ward, DO 05/11/14 2219

## 2014-05-11 NOTE — ED Notes (Signed)
Lower abdominal pain for 3 weeks. States it happens when he bends at work.

## 2014-07-16 ENCOUNTER — Other Ambulatory Visit: Payer: Self-pay | Admitting: Gastroenterology

## 2015-03-17 ENCOUNTER — Emergency Department (HOSPITAL_BASED_OUTPATIENT_CLINIC_OR_DEPARTMENT_OTHER)
Admission: EM | Admit: 2015-03-17 | Discharge: 2015-03-17 | Disposition: A | Discharge status: Home / Self Care | Payer: No Typology Code available for payment source | Attending: Emergency Medicine | Admitting: Emergency Medicine

## 2015-03-17 ENCOUNTER — Encounter (HOSPITAL_BASED_OUTPATIENT_CLINIC_OR_DEPARTMENT_OTHER): Payer: Self-pay

## 2015-03-17 DIAGNOSIS — Y9389 Activity, other specified: Secondary | ICD-10-CM | POA: Diagnosis not present

## 2015-03-17 DIAGNOSIS — S3992XA Unspecified injury of lower back, initial encounter: Secondary | ICD-10-CM | POA: Diagnosis not present

## 2015-03-17 DIAGNOSIS — Z8719 Personal history of other diseases of the digestive system: Secondary | ICD-10-CM | POA: Insufficient documentation

## 2015-03-17 DIAGNOSIS — Y998 Other external cause status: Secondary | ICD-10-CM | POA: Insufficient documentation

## 2015-03-17 DIAGNOSIS — E669 Obesity, unspecified: Secondary | ICD-10-CM | POA: Diagnosis not present

## 2015-03-17 DIAGNOSIS — Z87891 Personal history of nicotine dependence: Secondary | ICD-10-CM | POA: Insufficient documentation

## 2015-03-17 DIAGNOSIS — S29002A Unspecified injury of muscle and tendon of back wall of thorax, initial encounter: Secondary | ICD-10-CM | POA: Diagnosis not present

## 2015-03-17 DIAGNOSIS — Y92410 Unspecified street and highway as the place of occurrence of the external cause: Secondary | ICD-10-CM | POA: Insufficient documentation
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
## 2016-03-23 IMAGING — US US ABDOMEN COMPLETE
1 series · 14 of 25 positions shown · non-contrast
Comparison: Abdominal ultrasound February 24, 2013

CLINICAL DATA: Generalized abdominal pain; history of
cholecystectomy ; history of nausea vomiting and diarrhea

EXAM:
ULTRASOUND ABDOMEN COMPLETE

[Series 1: us abdomen complete · 0.33mm/px · 14 of 58 slices shown]
[im 1/58]
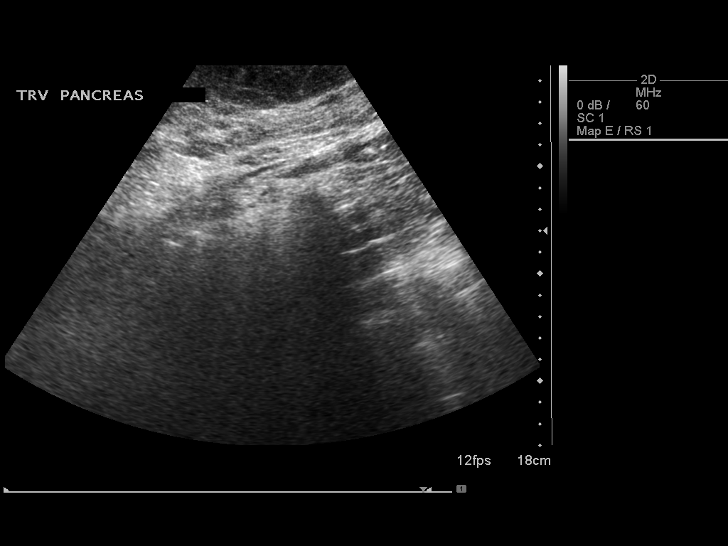
[im 5/58]
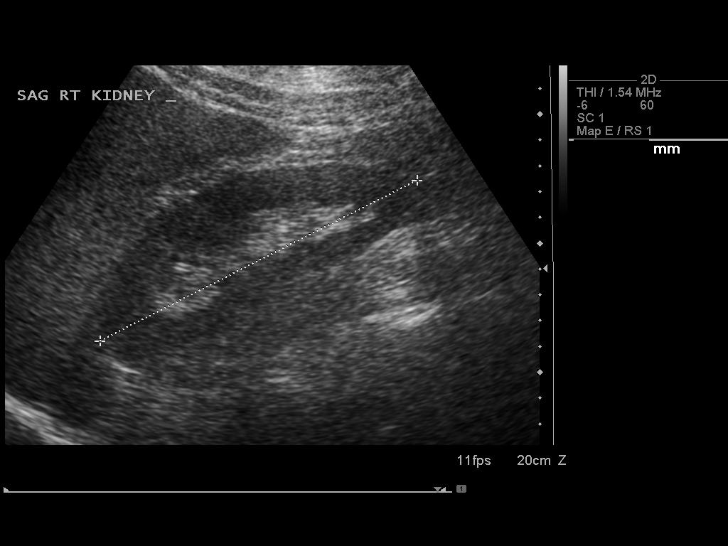
[im 10/58]
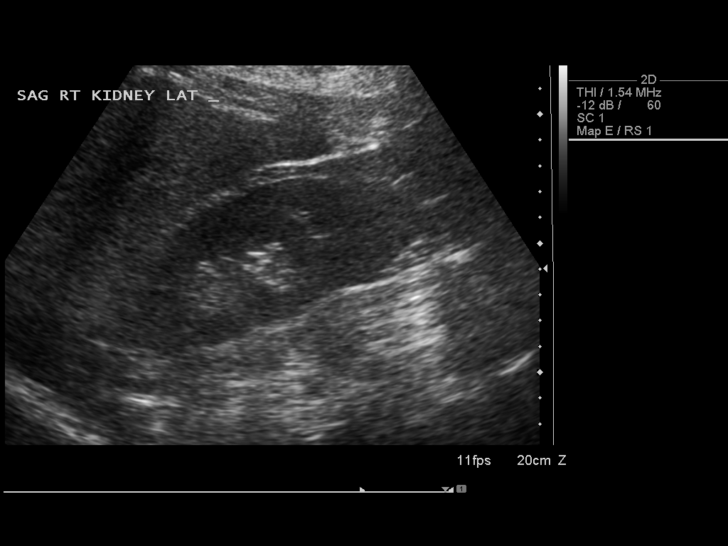
[im 15/58]
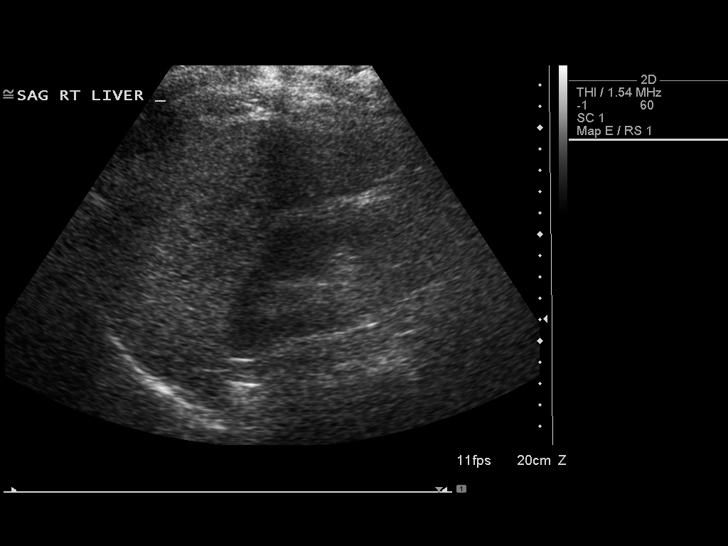
[im 20/58]
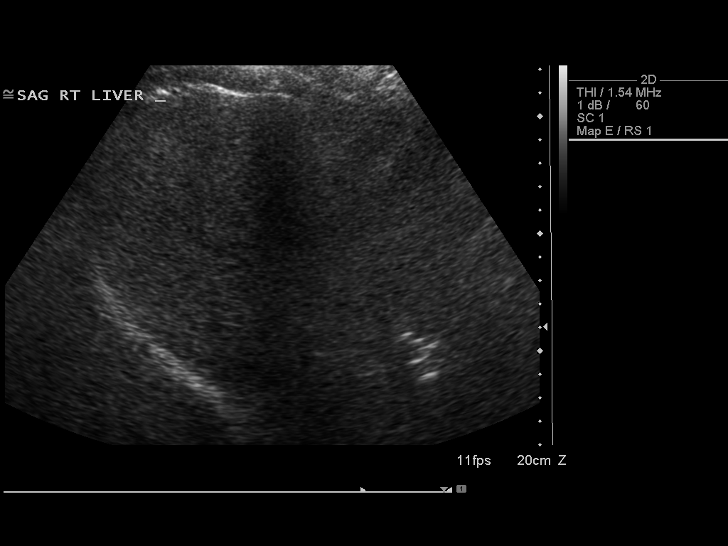
[im 22/58]
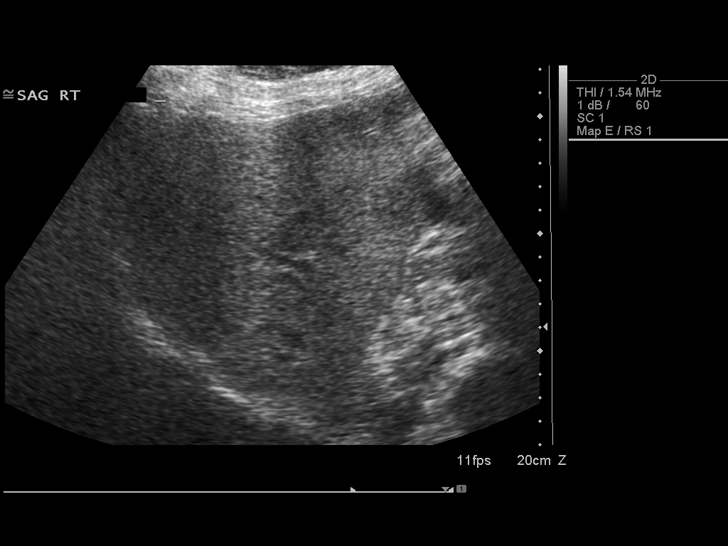
[im 27/58]
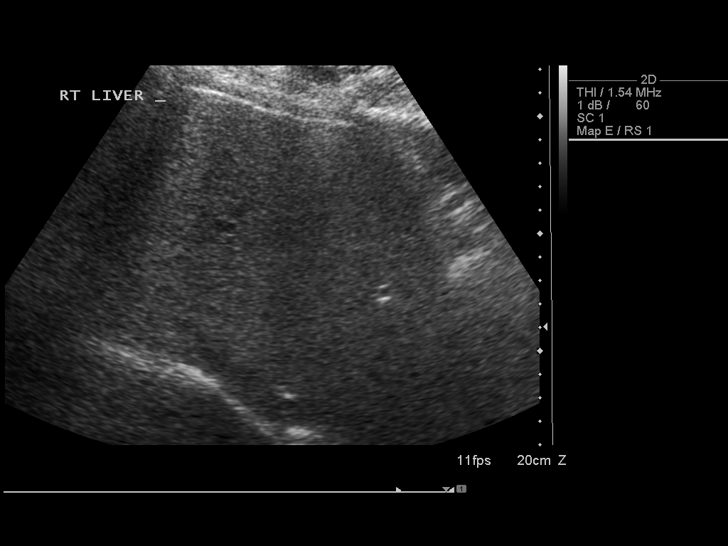
[im 31/58]
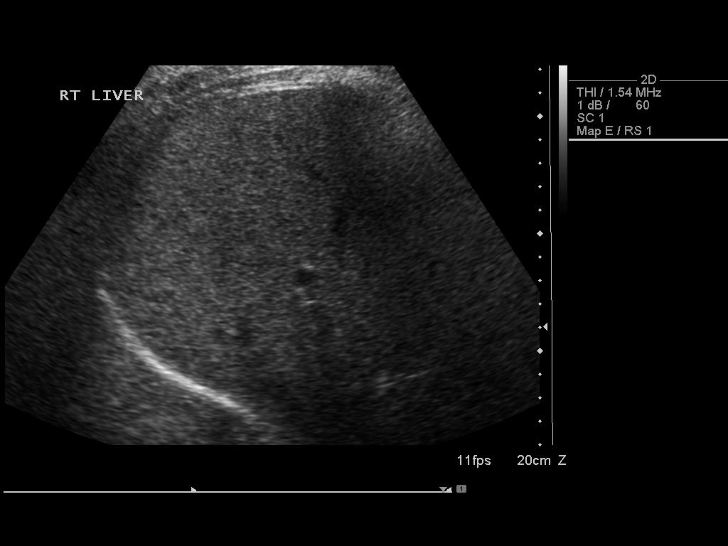
[im 36/58]
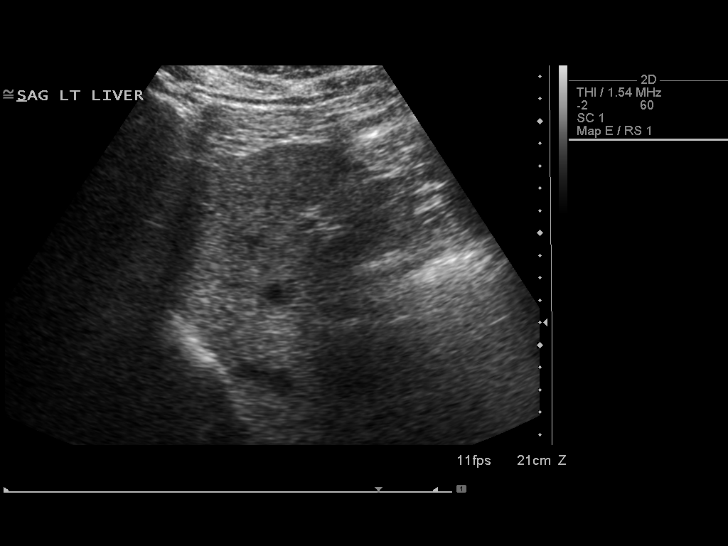
[im 39/58]
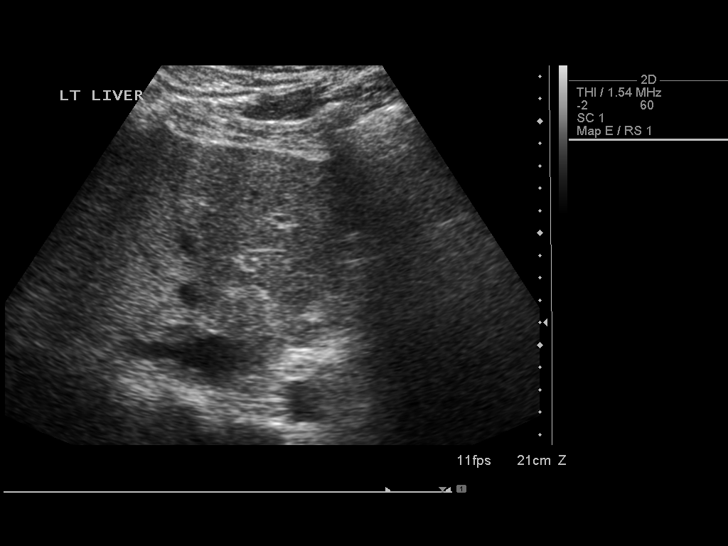
[im 43/58]
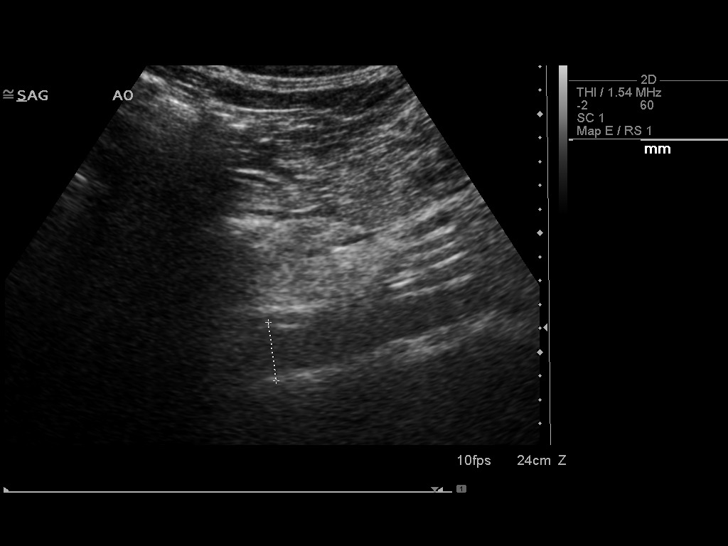
[im 48/58]
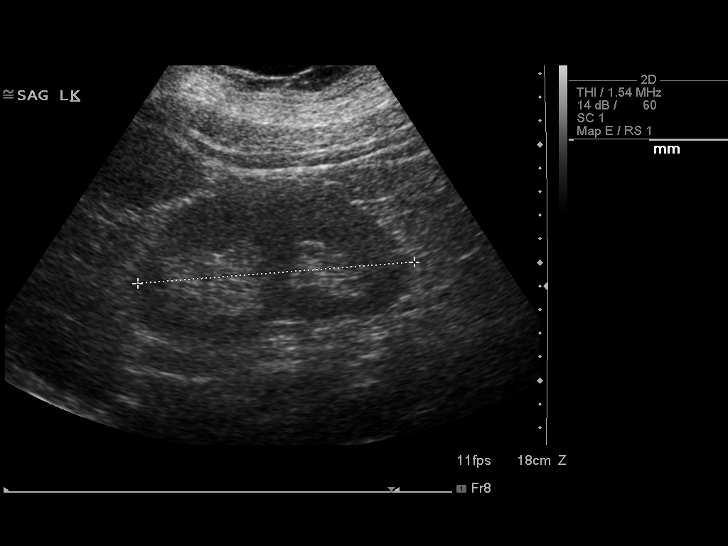
[im 53/58]
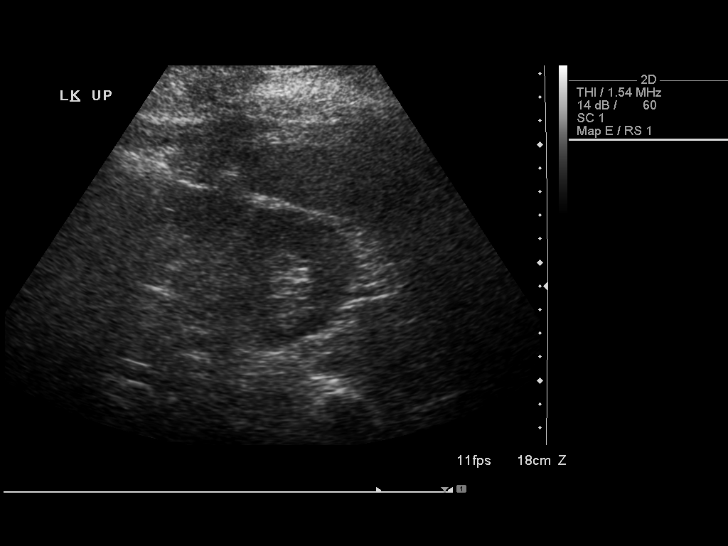
[im 58/58]
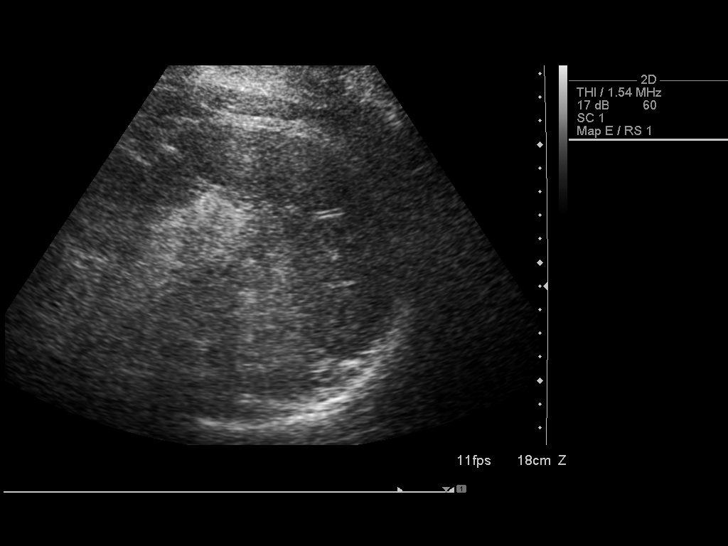

[14 of 25 positions shown; findings below may reference images not displayed]

FINDINGS: Gallbladder: The gallbladder is surgically absent.

Common bile duct: Diameter: 4.5 mm

Liver: The hepatic echotexture is heterogeneous. There is no focal
mass or ductal dilation.

IVC: Bowel gas limited evaluation of the inferior vena cava.

Pancreas: The pancreas was obscured by bowel gas.

Spleen: Size and appearance within normal limits.

Right Kidney: Length: 13.8 cm. Echogenicity within normal limits. No
mass or hydronephrosis visualized.

Left Kidney: Length: 12.6 cm. Echogenicity within normal limits. No
mass or hydronephrosis visualized.

Abdominal aorta: No aneurysm visualized. Bifurcation and iliac
arteries were obscured by bowel gas.

Other findings: No ascites is demonstrated.
IMPRESSION: No acute intra-abdominal abnormality is demonstrated.

## 2017-05-17 DIAGNOSIS — K080 Exfoliation of teeth due to systemic causes: Secondary | ICD-10-CM | POA: Diagnosis not present

## 2017-09-24 DIAGNOSIS — L309 Dermatitis, unspecified: Secondary | ICD-10-CM | POA: Diagnosis not present

## 2017-09-25 DIAGNOSIS — K080 Exfoliation of teeth due to systemic causes: Secondary | ICD-10-CM | POA: Diagnosis not present

## 2017-10-25 ENCOUNTER — Ambulatory Visit: Payer: 59 | Admitting: Dietician

## 2017-10-31 DIAGNOSIS — K080 Exfoliation of teeth due to systemic causes: Secondary | ICD-10-CM | POA: Diagnosis not present

## 2017-11-01 ENCOUNTER — Ambulatory Visit: Payer: 59 | Admitting: Dietician

## 2017-11-13 ENCOUNTER — Ambulatory Visit: Payer: 59 | Admitting: Dietician

## 2018-05-02 ENCOUNTER — Other Ambulatory Visit: Payer: Self-pay

## 2018-05-03 ENCOUNTER — Ambulatory Visit: Payer: Federal, State, Local not specified - PPO | Admitting: Family Medicine

## 2018-05-03 ENCOUNTER — Other Ambulatory Visit (INDEPENDENT_AMBULATORY_CARE_PROVIDER_SITE_OTHER): Payer: Federal, State, Local not specified - PPO

## 2018-05-03 ENCOUNTER — Encounter: Payer: Self-pay | Admitting: Family Medicine

## 2018-05-03 ENCOUNTER — Ambulatory Visit (INDEPENDENT_AMBULATORY_CARE_PROVIDER_SITE_OTHER)
Admission: RE | Admit: 2018-05-03 | Discharge: 2018-05-03 | Disposition: A | Discharge status: Home / Self Care | Payer: Federal, State, Local not specified - PPO | Source: Ambulatory Visit | Attending: Family Medicine | Admitting: Family Medicine

## 2018-05-03 VITALS — BP 144/98 | HR 85 | Temp 98.3°F | Ht 71.5 in | Wt >= 6400 oz

## 2018-05-03 DIAGNOSIS — R7309 Other abnormal glucose: Secondary | ICD-10-CM

## 2018-05-03 DIAGNOSIS — R030 Elevated blood-pressure reading, without diagnosis of hypertension: Secondary | ICD-10-CM | POA: Diagnosis not present

## 2018-05-03 DIAGNOSIS — G8929 Other chronic pain: Secondary | ICD-10-CM

## 2018-05-03 DIAGNOSIS — M5441 Lumbago with sciatica, right side: Secondary | ICD-10-CM | POA: Diagnosis not present

## 2018-05-03 DIAGNOSIS — M545 Low back pain: Secondary | ICD-10-CM | POA: Diagnosis not present

## 2018-05-03 LAB — COMPREHENSIVE METABOLIC PANEL
ALBUMIN: 3.7 g/dL (ref 3.5–5.2)
ALT: 50 U/L (ref 0–53)
AST: 38 U/L — AB (ref 0–37)
Alkaline Phosphatase: 101 U/L (ref 39–117)
BILIRUBIN TOTAL: 0.7 mg/dL (ref 0.2–1.2)
BUN: 11 mg/dL (ref 6–23)
CO2: 28 meq/L (ref 19–32)
CREATININE: 0.82 mg/dL (ref 0.40–1.50)
Calcium: 9 mg/dL (ref 8.4–10.5)
Chloride: 104 mEq/L (ref 96–112)
GFR: 127.34 mL/min (ref >60.00)
Glucose, Bld: 156 mg/dL — ABNORMAL HIGH (ref 70–99)
Potassium: 3.5 mEq/L (ref 3.5–5.1)
Sodium: 141 mEq/L (ref 135–145)
Total Protein: 6.4 g/dL (ref 6.0–8.3)

## 2018-05-03 LAB — LIPID PANEL
Cholesterol: 154 mg/dL (ref 0–200)
HDL: 31.9 mg/dL — ABNORMAL LOW (ref >39.00)
LDL Cholesterol: 92 mg/dL (ref 0–99)
NonHDL: 121.72
TRIGLYCERIDES: 150 mg/dL — AB (ref 0.0–149.0)
Total CHOL/HDL Ratio: 5
VLDL: 30 mg/dL (ref 0.0–40.0)

## 2018-05-03 LAB — CBC
HCT: 40 % (ref 39.0–52.0)
Hemoglobin: 13.3 g/dL (ref 13.0–17.0)
MCHC: 33.1 g/dL (ref 30.0–36.0)
MCV: 80.1 fl (ref 78.0–100.0)
Platelets: 361 10*3/uL (ref 150.0–400.0)
RBC: 5 Mil/uL (ref 4.22–5.81)
RDW: 15.4 % (ref 11.5–15.5)
WBC: 7.5 10*3/uL (ref 4.0–10.5)

## 2018-05-03 LAB — TSH: TSH: 0.85 u[IU]/mL (ref 0.35–4.50)

## 2018-05-03 LAB — HEMOGLOBIN A1C: Hgb A1c MFr Bld: 6.3 % (ref 4.6–6.5)

## 2018-05-03 NOTE — Progress Notes (Signed)
Test added.   

## 2018-05-03 NOTE — Progress Notes (Signed)
Please add a1c for elevated glucose.

## 2018-05-03 NOTE — Patient Instructions (Signed)
Exercising to Lose Weight Exercise is structured, repetitive physical activity to improve fitness and health. Getting regular exercise is important for everyone. It is especially important if you are overweight. Being overweight increases your risk of heart disease, stroke, diabetes, high blood pressure, and several types of cancer. Reducing your calorie intake and exercising can help you lose weight. Exercise is usually categorized as moderate or vigorous intensity. To lose weight, most people need to do a certain amount of moderate-intensity or vigorous-intensity exercise each week. Moderate-intensity exercise  Moderate-intensity exercise is any activity that gets you moving enough to burn at least three times more energy (calories) than if you were sitting. Examples of moderate exercise include:  Walking a mile in 15 minutes.  Doing light yard work.  Biking at an easy pace. Most people should get at least 150 minutes (2 hours and 30 minutes) a week of moderate-intensity exercise to maintain their body weight. Vigorous-intensity exercise Vigorous-intensity exercise is any activity that gets you moving enough to burn at least six times more calories than if you were sitting. When you exercise at this intensity, you should be working hard enough that you are not able to carry on a conversation. Examples of vigorous exercise include:  Running.  Playing a team sport, such as football, basketball, and soccer.  Jumping rope. Most people should get at least 75 minutes (1 hour and 15 minutes) a week of vigorous-intensity exercise to maintain their body weight. How can exercise affect me? When you exercise enough to burn more calories than you eat, you lose weight. Exercise also reduces body fat and builds muscle. The more muscle you have, the more calories you burn. Exercise also:  Improves mood.  Reduces stress and tension.  Improves your overall fitness, flexibility, and endurance.   Increases bone strength. The amount of exercise you need to lose weight depends on:  Your age.  The type of exercise.  Any health conditions you have.  Your overall physical ability. Talk to your health care provider about how much exercise you need and what types of activities are safe for you. What actions can I take to lose weight? Nutrition   Make changes to your diet as told by your health care provider or diet and nutrition specialist (dietitian). This may include: ? Eating fewer calories. ? Eating more protein. ? Eating less unhealthy fats. ? Eating a diet that includes fresh fruits and vegetables, whole grains, low-fat dairy products, and lean protein. ? Avoiding foods with added fat, salt, and sugar.  Drink plenty of water while you exercise to prevent dehydration or heat stroke. Activity  Choose an activity that you enjoy and set realistic goals. Your health care provider can help you make an exercise plan that works for you.  Exercise at a moderate or vigorous intensity most days of the week. ? The intensity of exercise may vary from person to person. You can tell how intense a workout is for you by paying attention to your breathing and heartbeat. Most people will notice their breathing and heartbeat get faster with more intense exercise.  Do resistance training twice each week, such as: ? Push-ups. ? Sit-ups. ? Lifting weights. ? Using resistance bands.  Getting short amounts of exercise can be just as helpful as long structured periods of exercise. If you have trouble finding time to exercise, try to include exercise in your daily routine. ? Get up, stretch, and walk around every 30 minutes throughout the day. ? Go for a   walk during your lunch break. ? Park your car farther away from your destination. ? If you take public transportation, get off one stop early and walk the rest of the way. ? Make phone calls while standing up and walking around. ? Take the  stairs instead of elevators or escalators.  Wear comfortable clothes and shoes with good support.  Do not exercise so much that you hurt yourself, feel dizzy, or get very short of breath. Where to find more information  [place] Department of Health and Human Services: ThisPath.fi  Centers for Disease Control and Prevention (CDC): FootballExhibition.com.br Contact a health care provider:  Before starting a new exercise program.  If you have questions or concerns about your weight.  If you have a medical problem that keeps you from exercising. Get help right away if you have any of the following while exercising:  Injury.  Dizziness.  Difficulty breathing or shortness of breath that does not go away when you stop exercising.  Chest pain.  Rapid heartbeat. Summary  Being overweight increases your risk of heart disease, stroke, diabetes, high blood pressure, and several types of cancer.  Losing weight happens when you burn more calories than you eat.  Reducing the amount of calories you eat in addition to getting regular moderate or vigorous exercise each week helps you lose weight. This information is not intended to replace advice given to you by your health care provider. Make sure you discuss any questions you have with your health care provider. Document Released: 03/04/2010 Document Revised: 02/12/2017 Document Reviewed: 02/12/2017 Elsevier Interactive Patient Education  2019 [employer].    Ask your health care provider which exercises are safe for you. Do exercises exactly as told by your health care provider and adjust them as directed. It is normal to feel mild stretching, pulling, tightness, or discomfort as you do these exercises, but you should stop right away if you feel sudden pain or your pain gets worse. Do not begin these exercises until told by your health care provider. Stretching and range of motion exercises These exercises warm up your muscles and joints and improve the  movement and flexibility of your back. These exercises also help to relieve pain, numbness, and tingling. Exercise A: Single knee to chest  1. Lie on your back on a firm surface with both legs straight. 2. Bend one of your knees. Use your hands to move your knee up toward your chest until you feel a gentle stretch in your lower back and buttock. ? Hold your leg in this position by holding onto the front of your knee. ? Keep your other leg as straight as possible. 3. Hold for __________ seconds. 4. Slowly return to the starting position. 5. Repeat with your other leg. Repeat __________ times. Complete this exercise __________ times a day. Exercise B: Prone extension on elbows  1. Lie on your abdomen on a firm surface. 2. Prop yourself up on your elbows. 3. Use your arms to help lift your chest up until you feel a gentle stretch in your abdomen and your lower back. ? This will place some of your body weight on your elbows. If this is uncomfortable, try stacking pillows under your chest. ? Your hips should stay down, against the surface that you are lying on. Keep your hip and back muscles relaxed. 4. Hold for __________ seconds. 5. Slowly relax your upper body and return to the starting position. Repeat __________ times. Complete this exercise __________ times a day.  Strengthening exercises These exercises build strength and endurance in your back. Endurance is the ability to use your muscles for a long time, even after they get tired. Exercise C: Pelvic tilt 1. Lie on your back on a firm surface. Bend your knees and keep your feet flat. 2. Tense your abdominal muscles. Tip your pelvis up toward the ceiling and flatten your lower back into the floor. ? To help with this exercise, you may place a small towel under your lower back and try to push your back into the towel. 3. Hold for __________ seconds. 4. Let your muscles relax completely before you repeat this exercise. Repeat __________  times. Complete this exercise __________ times a day. Exercise D: Alternating arm and leg raises  1. Get on your hands and knees on a firm surface. If you are on a hard floor, you may want to use padding to cushion your knees, such as an exercise mat. 2. Line up your arms and legs. Your hands should be below your shoulders, and your knees should be below your hips. 3. Lift your left leg behind you. At the same time, raise your right arm and straighten it in front of you. ? Do not lift your leg higher than your hip. ? Do not lift your arm higher than your shoulder. ? Keep your abdominal and back muscles tight. ? Keep your hips facing the ground. ? Do not arch your back. ? Keep your balance carefully, and do not hold your breath. 4. Hold for __________ seconds. 5. Slowly return to the starting position and repeat with your right leg and your left arm. Repeat __________ times. Complete this exercise __________times a day. Exercise J: Single leg lower with bent knees 1. Lie on your back on a firm surface. 2. Tense your abdominal muscles and lift your feet off the floor, one foot at a time, so your knees and hips are bent in an "L" shape (at about 90 degrees). ? Your knees should be over your hips and your lower legs should be parallel to the floor. 3. Keeping your abdominal muscles tense and your knee bent, slowly lower one of your legs so your toe touches the ground. 4. Lift your leg back up to return to the starting position. ? Do not hold your breath. ? Do not let your back arch. Keep your back flat against the ground. 5. Repeat with your other leg. Repeat __________ times. Complete this exercise __________ times a day. Posture and body mechanics  Body mechanics refers to the movements and positions of your body while you do your daily activities. Posture is part of body mechanics. Good posture and healthy body mechanics can help to relieve stress in your body's tissues and joints. Good  posture means that your spine is in its natural S-curve position (your spine is neutral), your shoulders are pulled back slightly, and your head is not tipped forward. The following are general guidelines for applying improved posture and body mechanics to your everyday activities. Standing   When standing, keep your spine neutral and your feet about hip-width apart. Keep a slight bend in your knees. Your ears, shoulders, and hips should line up.  When you do a task in which you stand in one place for a long time, place one foot up on a stable object that is 2-4 inches (5-10 cm) high, such as a footstool. This helps keep your spine neutral. Sitting   When sitting, keep your spine neutral and keep  your feet flat on the floor. Use a footrest, if necessary, and keep your thighs parallel to the floor. Avoid rounding your shoulders, and avoid tilting your head forward.  When working at a desk or a computer, keep your desk at a height where your hands are slightly lower than your elbows. Slide your chair under your desk so you are close enough to maintain good posture.  When working at a computer, place your monitor at a height where you are looking straight ahead and you do not have to tilt your head forward or downward to look at the screen. Resting   When lying down and resting, avoid positions that are most painful for you.  If you have pain with activities such as sitting, bending, stooping, or squatting (flexion-based activities), lie in a position in which your body does not bend very much. For example, avoid curling up on your side with your arms and knees near your chest (fetal position).  If you have pain with activities such as standing for a long time or reaching with your arms (extension-based activities), lie with your spine in a neutral position and bend your knees slightly. Try the following positions: ? Lying on your side with a pillow between your knees. ? Lying on your back with  a pillow under your knees. Lifting   When lifting objects, keep your feet at least shoulder-width apart and tighten your abdominal muscles.  Bend your knees and hips and keep your spine neutral. It is important to lift using the strength of your legs, not your back. Do not lock your knees straight out.  Always ask for help to lift heavy or awkward objects. This information is not intended to replace advice given to you by your health care provider. Make sure you discuss any questions you have with your health care provider. Document Released: 01/30/2005 Document Revised: 10/07/2015 Document Reviewed: 11/11/2014 Elsevier Interactive Patient Education  2019 [employer].

## 2018-05-05 ENCOUNTER — Encounter: Payer: Self-pay | Admitting: Family Medicine

## 2018-05-05 DIAGNOSIS — G8929 Other chronic pain: Secondary | ICD-10-CM | POA: Insufficient documentation

## 2018-05-05 DIAGNOSIS — R030 Elevated blood-pressure reading, without diagnosis of hypertension: Secondary | ICD-10-CM | POA: Insufficient documentation

## 2018-05-05 DIAGNOSIS — M5441 Lumbago with sciatica, right side: Principal | ICD-10-CM

## 2018-05-05 NOTE — Assessment & Plan Note (Signed)
-  Discussed with him that I agree that his weight is an issue and likely to contribute to co-morbidities now or in the future if no intervention now.  -Will obtain baseline labs today -Referral to nutrition made -Discussed need for daily activity, at least  -May be a good candidate for bariatric surgery if able to to lose some weight from current baseline.

## 2018-05-05 NOTE — Assessment & Plan Note (Addendum)
-  No prior hx of HTN, will recheck at f/u. Recommend low salt diet.  If remains elevated will discuss medications.

## 2018-05-05 NOTE — Progress Notes (Signed)
Adam Rosario - 38 y.o. male MRN [id number]  Date of birth: 08-Aug-1980  Subjective Chief Complaint  Patient presents with  . Establish Care    NP,  R lat side rad numbness to groin area, obesity/wt management    HPI Adam Rosario is a 38 y.o. male here today for initial visit with PCP.  He would like to discuss weight management and low back pain.    -Obesity:  Reports being overweight for most of his life.  Weight in the 470's previously but has been able to get down to current weight.  He states he has tried multiple diets in the past with yo-yo effect on his weight.  He is a [employer] and diet consists of quite a bit of fast food.  He is fairly sedentary. He has no underlying medical conditions that he is aware of.    -Low back pain:  Low back pain for several months with radiation into R leg and R sided inguinal numbness.  Numbness comes and goes.  He denies weakness, bowel or bladder incontinence/retention.   He was seen by previous PCP for this and was told he needed to lose weight.  He reports not having any addition evaluation including imaging for this.   ROS:  A comprehensive ROS was completed and negative except as noted per HPI   No Known Allergies  Past Medical History:  Diagnosis Date  . Cholecystitis   . Gallstones   . Obesity   . Pancreatitis     Past Surgical History:  Procedure Laterality Date  . CHOLECYSTECTOMY N/A 02/27/2013   Procedure: LAPAROSCOPIC CHOLECYSTECTOMY WITH INTRAOPERATIVE CHOLANGIOGRAM;  Surgeon: Clovis Pu. Cornett, MD;  Location: [facility] OR;  Service: General;  Laterality: N/A;  . HAND SURGERY     left    Social History   Socioeconomic History  . Marital status: Single    Spouse name: Not on file  . Number of children: Not on file  . Years of education: Not on file  . Highest education level: Not on file  Occupational History  . Not on file  Social Needs  . Financial resource strain: Not on file  . Food insecurity:    Worry: Not on  file    Inability: Not on file  . Transportation needs:    Medical: Not on file    Non-medical: Not on file  Tobacco Use  . Smoking status: Former Smoker    Packs/day: 0.50    Types: Cigarettes    Last attempt to quit: 02/17/2013    Years since quitting: 5.2  . Smokeless tobacco: Never Used  Substance and Sexual Activity  . Alcohol use: Yes    Comment: occ  . Drug use: No  . Sexual activity: Not on file  Lifestyle  . Physical activity:    Days per week: Not on file    Minutes per session: Not on file  . Stress: Not on file  Relationships  . Social connections:    Talks on phone: Not on file    Gets together: Not on file    Attends religious service: Not on file    Active member of club or organization: Not on file    Attends meetings of clubs or organizations: Not on file    Relationship status: Not on file  Other Topics Concern  . Not on file  Social History Narrative  . Not on file    Family History  Problem Relation Age of Onset  .  Congestive Heart Failure Mother     Health Maintenance  Topic Date Due  . HIV Screening  06/30/1995  . TETANUS/TDAP  06/30/1999  . INFLUENZA VACCINE  09/13/2017    ----------------------------------------------------------------------------------------------------------------------------------------------------------------------------------------------------------------- Physical Exam BP (!) 144/98 (BP Location: Left Arm, Patient Position: Sitting, Cuff Size: Large)   Pulse 85   Temp 98.3 F (36.8 C) (Oral)   Ht 5' 11.5" (1.816 m)   Wt (!) 456 lb 12.8 oz (207.2 kg)   SpO2 94%   BMI 62.82 kg/m   Physical Exam Constitutional:      Appearance: He is obese. He is not ill-appearing.  HENT:     Head: Normocephalic and atraumatic.     Mouth/Throat:     Mouth: Mucous membranes are moist.  Eyes:     General: No scleral icterus. Neck:     Musculoskeletal: Neck supple.  Cardiovascular:     Rate and Rhythm: Normal rate and  regular rhythm.  Pulmonary:     Effort: Pulmonary effort is normal.     Breath sounds: Normal breath sounds.  Musculoskeletal:     Comments: ROM is relatively normal, limited by body habitus.  LE strength is normal bilaterally.  Sensation intact   Skin:    General: Skin is warm and dry.  Neurological:     General: No focal deficit present.     Mental Status: He is alert.  Psychiatric:        Mood and Affect: Mood normal.        Behavior: Behavior normal.     ------------------------------------------------------------------------------------------------------------------------------------------------------------------------------------------------------------------- Assessment and Plan  Morbid obesity (HCC) -Discussed with him that I agree that his weight is an issue and likely to contribute to co-morbidities now or in the future if no intervention now.  -Will obtain baseline labs today -Referral to nutrition made -Discussed need for daily activity, at least  -May be a good candidate for bariatric surgery if able to to lose some weight from current baseline.   Elevated blood-pressure reading without diagnosis of hypertension -No prior hx of HTN, will recheck at f/u. Recommend low salt diet.  If remains elevated will discuss medications.   Chronic right-sided low back pain with right-sided sciatica -Chronic in nature -Xrays ordered, may need MRI in the future -Discussed weight loss and increased exercise -Given HEP.   >45 minutes spent face to face with patient with 50% of time spent counseling patient and coordination care on conditions as outlined above.

## 2018-05-05 NOTE — Assessment & Plan Note (Signed)
-  Chronic in nature -Xrays ordered, may need MRI in the future -Discussed weight loss and increased exercise -Given HEP.

## 2018-05-08 ENCOUNTER — Telehealth: Payer: Self-pay

## 2018-05-08 NOTE — Progress Notes (Signed)
Sent My-Chart message

## 2018-05-08 NOTE — Telephone Encounter (Signed)
-----   Message from [place], DO sent at 05/08/2018 10:41 AM EDT ----- Glucose is elevated with a1c consistent wit pre-diabetes.  I would recommend he follow a diet low in simple sugars and carbohydrates. -Xray shows some arthritis and mild disc disease.  Recommend he continue with stretches provided on AVS.  If not improving we can consider getting MRI.

## 2018-05-08 NOTE — Progress Notes (Signed)
Glucose is elevated with a1c consistent wit pre-diabetes.  I would recommend he follow a diet low in simple sugars and carbohydrates. -Xray shows some arthritis and mild disc disease.  Recommend he continue with stretches provided on AVS.  If not improving we can consider getting MRI.

## 2018-06-02 ENCOUNTER — Telehealth: Payer: Federal, State, Local not specified - PPO | Admitting: Nurse Practitioner

## 2018-06-02 DIAGNOSIS — R6889 Other general symptoms and signs: Principal | ICD-10-CM

## 2018-06-02 DIAGNOSIS — Z20822 Contact with and (suspected) exposure to covid-19: Secondary | ICD-10-CM

## 2018-06-02 MED ORDER — BENZONATATE 100 MG PO CAPS: 100.0000 mg | ORAL_CAPSULE | Freq: Three times a day (TID) | ORAL | 0 refills | Status: AC | PRN

## 2018-06-02 NOTE — Progress Notes (Signed)
E-Visit for Corona Virus Screening  Based on your current symptoms, you may very well have the virus, however your symptoms are mild. Currently, not all patients are being tested. If the symptoms are mild and there is not a known exposure, performing the test is not indicated.  Coronavirus disease 2019 (COVID-19) is a respiratory illness that can spread from person to person. The virus that causes COVID-19 is a new virus that was first identified in the country of [place] but is now found in multiple other countries and has spread to the [place].  Symptoms associated with the virus are mild to severe fever, cough, and shortness of breath. There is currently no vaccine to protect against COVID-19, and there is no specific antiviral treatment for the virus.   To be considered HIGH RISK for Coronavirus (COVID-19), you have to meet the following criteria:  . Traveled to [place], [place], [place], [place] or [place]; or in the [place] to [place], [place], [place], or [place]; and have fever, cough, and shortness of breath within the last 2 weeks of travel OR  . Been in close contact with a person diagnosed with COVID-19 within the last 2 weeks and have fever, cough, and shortness of breath  . IF YOU DO NOT MEET THESE CRITERIA, YOU ARE CONSIDERED LOW RISK FOR COVID-19.   It is vitally important that if you feel that you have an infection such as this virus or any other virus that you stay home and away from places where you may spread it to others.  You should self-quarantine for 14 days if you have symptoms that could potentially be coronavirus and avoid contact with people age 65 and older.   You can use medication such as A prescription cough medication called Tessalon Perles 100 mg. You may take 1-2 capsules every 8 hours as needed for cough  You may also take acetaminophen (Tylenol) as needed for fever.   Reduce your risk of any infection by using the same precautions used for  avoiding the common cold or flu:  . Wash your hands often with soap and warm water for at least 20 seconds.  If soap and water are not readily available, use an alcohol-based hand sanitizer with at least 60% alcohol.  . If coughing or sneezing, cover your mouth and nose by coughing or sneezing into the elbow areas of your shirt or coat, into a tissue or into your sleeve (not your hands). . Avoid shaking hands with others and consider head nods or verbal greetings only. . Avoid touching your eyes, nose, or mouth with unwashed hands.  . Avoid close contact with people who are sick. . Avoid places or events with large numbers of people in one location, like concerts or sporting events. . Carefully consider travel plans you have or are making. . If you are planning any travel outside or inside the [place], visit the CDC's Travelers' Health webpage for the latest health notices. . If you have some symptoms but not all symptoms, continue to monitor at home and seek medical attention if your symptoms worsen. . If you are having a medical emergency, call 911.  HOME CARE . Only take medications as instructed by your medical team. . Drink plenty of fluids and get plenty of rest. . A steam or ultrasonic humidifier can help if you have congestion.   GET HELP RIGHT AWAY IF: . You develop worsening fever. . You become short of breath . You cough   up blood. . Your symptoms become more severe MAKE SURE YOU   Understand these instructions.  Will watch your condition.  Will get help right away if you are not doing well or get worse.  Your e-visit answers were reviewed by a board certified advanced clinical practitioner to complete your personal care plan.  Depending on the condition, your plan could have included both over the counter or prescription medications.  If there is a problem please reply once you have received a response from your provider. Your safety is important to us.  If you have drug  allergies check your prescription carefully.    You can use MyChart to ask questions about today's visit, request a non-urgent call back, or ask for a work or school excuse for 24 hours related to this e-Visit. If it has been greater than 24 hours you will need to follow up with your provider, or enter a new e-Visit to address those concerns. You will get an e-mail in the next two days asking about your experience.  I hope that your e-visit has been valuable and will speed your recovery. Thank you for using e-visits.  5 minutes spent reviewing and documenting in chart.  

## 2018-06-03 ENCOUNTER — Ambulatory Visit: Payer: Self-pay

## 2018-06-03 NOTE — Telephone Encounter (Signed)
Fever started on Saturday up to 102. Afebrile today. Symptoms are  Bones ache, chills. Denies cough. Pt with mild headache. Home care advice given and pt verbalized understanding.           Reason for Disposition . 1] COVID-19 infection diagnosed or suspected AND [2] mild symptoms (fever, cough) AND [2] no trouble breathing or other complications  Answer Assessment - Initial Assessment Questions 1. COVID-19 DIAGNOSIS: "Who made your Coronavirus (COVID-19) diagnosis?" "Was it confirmed by a positive lab test?" If not diagnosed by a HCP, ask "Are there lots of cases (community spread) where you live?" (See public health department website, if unsure)   * MAJOR community spread: high number of cases; numbers of cases are increasing; many people hospitalized.   * MINOR community spread: low number of cases; not increasing; few or no people hospitalized     Major- wants a test 2. ONSET: "When did the COVID-19 symptoms start?"      saturdy 3. WORST SYMPTOM: "What is your worst symptom?" (e.g., cough, fever, shortness of breath, muscle aches)     Bones aches, chills, had 102 fever over the weekend and today was 98.0 4. COUGH: "How bad is the cough?"       no 5. FEVER: "Do you have a fever?" If so, ask: "What is your temperature, how was it measured, and when did it start?"     Today no fever but did over the weekend- no fever today but still having chills- 102 over the weekend checking with a oral thermometer 6. RESPIRATORY STATUS: "Describe your breathing?" (e.g., shortness of breath, wheezing, unable to speak)     no 7. BETTER-SAME-WORSE: "Are you getting better, staying the same or getting worse compared to yesterday?"  If getting worse, ask, "In what way?"    Better- has more energy 8. HIGH RISK DISEASE: "Do you have any chronic medical problems?" (e.g., asthma, heart or lung disease, weak immune system, etc.)     no 9. PREGNANCY: "Is there any chance you are pregnant?" "When was your  last menstrual period?"     N/a 10. OTHER SYMPTOMS: "Do you have any other symptoms?"  (e.g., runny nose, headache, sore throat, loss of smell)       Headache (mild)  Protocols used: CORONAVIRUS (COVID-19) DIAGNOSED OR SUSPECTED-A-AH

## 2018-06-05 ENCOUNTER — Encounter: Payer: Self-pay | Admitting: Family Medicine

## 2019-09-05 DIAGNOSIS — Z1152 Encounter for screening for COVID-19: Secondary | ICD-10-CM | POA: Diagnosis not present

## 2019-09-05 DIAGNOSIS — B349 Viral infection, unspecified: Secondary | ICD-10-CM | POA: Diagnosis not present

## 2019-10-09 DIAGNOSIS — R0683 Snoring: Secondary | ICD-10-CM | POA: Diagnosis not present

## 2019-10-29 ENCOUNTER — Encounter: Payer: Self-pay | Admitting: Nurse Practitioner

## 2019-10-29 ENCOUNTER — Other Ambulatory Visit: Payer: Self-pay | Admitting: Nurse Practitioner

## 2019-10-29 DIAGNOSIS — R7303 Prediabetes: Secondary | ICD-10-CM

## 2019-11-03 DIAGNOSIS — G4710 Hypersomnia, unspecified: Secondary | ICD-10-CM | POA: Diagnosis not present

## 2019-11-03 DIAGNOSIS — R0683 Snoring: Secondary | ICD-10-CM | POA: Diagnosis not present

## 2019-11-14 DIAGNOSIS — Z713 Dietary counseling and surveillance: Secondary | ICD-10-CM | POA: Diagnosis not present

## 2019-11-18 DIAGNOSIS — Z6841 Body Mass Index (BMI) 40.0 and over, adult: Secondary | ICD-10-CM | POA: Diagnosis not present

## 2019-11-18 DIAGNOSIS — Z7189 Other specified counseling: Secondary | ICD-10-CM | POA: Diagnosis not present

## 2019-11-18 DIAGNOSIS — F54 Psychological and behavioral factors associated with disorders or diseases classified elsewhere: Secondary | ICD-10-CM | POA: Diagnosis not present

## 2019-11-19 DIAGNOSIS — Z723 Lack of physical exercise: Secondary | ICD-10-CM | POA: Diagnosis not present

## 2019-11-20 DIAGNOSIS — R030 Elevated blood-pressure reading, without diagnosis of hypertension: Secondary | ICD-10-CM | POA: Diagnosis not present

## 2019-11-20 DIAGNOSIS — Z13220 Encounter for screening for lipoid disorders: Secondary | ICD-10-CM | POA: Diagnosis not present

## 2019-11-20 DIAGNOSIS — R5383 Other fatigue: Secondary | ICD-10-CM | POA: Diagnosis not present

## 2019-11-20 DIAGNOSIS — Z87891 Personal history of nicotine dependence: Secondary | ICD-10-CM | POA: Diagnosis not present

## 2019-11-20 DIAGNOSIS — G8929 Other chronic pain: Secondary | ICD-10-CM | POA: Diagnosis not present

## 2019-11-20 DIAGNOSIS — Z131 Encounter for screening for diabetes mellitus: Secondary | ICD-10-CM | POA: Diagnosis not present

## 2019-11-20 DIAGNOSIS — Z01818 Encounter for other preprocedural examination: Secondary | ICD-10-CM | POA: Diagnosis not present

## 2019-11-20 DIAGNOSIS — Z136 Encounter for screening for cardiovascular disorders: Secondary | ICD-10-CM | POA: Diagnosis not present

## 2019-11-20 DIAGNOSIS — M5441 Lumbago with sciatica, right side: Secondary | ICD-10-CM | POA: Diagnosis not present

## 2019-11-20 DIAGNOSIS — Z6841 Body Mass Index (BMI) 40.0 and over, adult: Secondary | ICD-10-CM | POA: Diagnosis not present

## 2019-12-10 DIAGNOSIS — Z6841 Body Mass Index (BMI) 40.0 and over, adult: Secondary | ICD-10-CM | POA: Diagnosis not present

## 2019-12-13 DIAGNOSIS — G4733 Obstructive sleep apnea (adult) (pediatric): Secondary | ICD-10-CM | POA: Diagnosis not present

## 2019-12-24 DIAGNOSIS — R0683 Snoring: Secondary | ICD-10-CM | POA: Diagnosis not present

## 2019-12-24 DIAGNOSIS — G4710 Hypersomnia, unspecified: Secondary | ICD-10-CM | POA: Diagnosis not present

## 2019-12-24 DIAGNOSIS — G4733 Obstructive sleep apnea (adult) (pediatric): Secondary | ICD-10-CM | POA: Diagnosis not present

## 2022-07-03 ENCOUNTER — Emergency Department (HOSPITAL_BASED_OUTPATIENT_CLINIC_OR_DEPARTMENT_OTHER)
Admission: EM | Admit: 2022-07-03 | Discharge: 2022-07-03 | Disposition: A | Discharge status: Home / Self Care | Payer: Federal, State, Local not specified - PPO | Attending: Emergency Medicine | Admitting: Emergency Medicine

## 2022-07-03 ENCOUNTER — Emergency Department (HOSPITAL_BASED_OUTPATIENT_CLINIC_OR_DEPARTMENT_OTHER): Payer: Federal, State, Local not specified - PPO

## 2022-07-03 ENCOUNTER — Encounter (HOSPITAL_BASED_OUTPATIENT_CLINIC_OR_DEPARTMENT_OTHER): Payer: Self-pay | Admitting: Urology

## 2022-07-03 DIAGNOSIS — N50811 Right testicular pain: Secondary | ICD-10-CM | POA: Insufficient documentation

## 2022-07-03 DIAGNOSIS — R509 Fever, unspecified: Secondary | ICD-10-CM | POA: Insufficient documentation

## 2022-07-03 DIAGNOSIS — N50812 Left testicular pain: Secondary | ICD-10-CM | POA: Insufficient documentation

## 2022-07-03 DIAGNOSIS — R350 Frequency of micturition: Secondary | ICD-10-CM | POA: Diagnosis not present

## 2022-07-03 LAB — URINALYSIS, MICROSCOPIC (REFLEX)

## 2022-07-03 LAB — URINALYSIS, ROUTINE W REFLEX MICROSCOPIC
Glucose, UA: NEGATIVE mg/dL
Ketones, ur: NEGATIVE mg/dL
Leukocytes,Ua: NEGATIVE
Nitrite: NEGATIVE
Protein, ur: 100 mg/dL — AB
Specific Gravity, Urine: >=1.03 (ref 1.005–1.030)
pH: 5.5 (ref 5.0–8.0)

## 2022-07-03 LAB — BASIC METABOLIC PANEL
Anion gap: 9 (ref 5–15)
BUN: 11 mg/dL (ref 6–20)
CO2: 24 mmol/L (ref 22–32)
Calcium: 8.4 mg/dL — ABNORMAL LOW (ref 8.9–10.3)
Chloride: 104 mmol/L (ref 98–111)
Creatinine, Ser: 0.83 mg/dL (ref 0.61–1.24)
GFR, Estimated: >60 mL/min (ref >60)
Glucose, Bld: 124 mg/dL — ABNORMAL HIGH (ref 70–99)
Potassium: 3.4 mmol/L — ABNORMAL LOW (ref 3.5–5.1)
Sodium: 137 mmol/L (ref 135–145)

## 2022-07-03 LAB — CBC
HCT: 39.6 % (ref 39.0–52.0)
Hemoglobin: 12.8 g/dL — ABNORMAL LOW (ref 13.0–17.0)
MCH: 26 pg (ref 26.0–34.0)
MCHC: 32.3 g/dL (ref 30.0–36.0)
MCV: 80.5 fL (ref 80.0–100.0)
Platelets: 217 10*3/uL (ref 150–400)
RBC: 4.92 MIL/uL (ref 4.22–5.81)
RDW: 15.4 % (ref 11.5–15.5)
WBC: 4.7 10*3/uL (ref 4.0–10.5)
nRBC: 0 % (ref 0.0–0.2)

## 2022-07-03 NOTE — Discharge Instructions (Addendum)
You were seen in the emergency department today for groin swelling and fever.  As we discussed your lab work was all very reassuring today.  We do not see any evidence of infection, both on your urine sample or your blood work.  I did send your urine sample for culture.  So if you get a call in a few days from the hospital, admits that something grew out and they will be placing you on some antibiotics.  It is possible your discomfort could have come from a kidney stone you have already passed, but there's no definitive way to prove this.   I recommend continuing Tylenol as needed for pain and/or fever.  Make sure you are hydrating well.  Continue to monitor how you're doing and return to the ER for new or worsening symptoms such as persistent fever, severe testicular pain, etc.

## 2022-07-03 NOTE — ED Notes (Signed)
Urine sent to lab 

## 2022-07-03 NOTE — ED Provider Notes (Signed)
[place] [facility] Provider Note   CSN: [id number] Arrival date & time: 07/03/22  0906     History  Chief Complaint  Patient presents with   Groin Swelling   Fever    Adam Rosario is a 42 y.o. male with history of gallstones, pancreatitis, obesity, who presents to the ER complaining of fever, testicular swelling, with urinary symptoms. States he had a low grade fever about 3 days ago, with associated headache, temperature of around 101 F. Started having testicular swelling the same day, with dark urine. Now having urinary urgency and frequency, no dysuria. Had some back pain that he felt like was radiating down his backside to his groin. Reports both testicles feeling swollen and tender, even to the touch. No discharge. Has been constipated for the past few days, but was able to have a BM today. No pain with BM. Has been taking tylenol for pain and fever.    Fever Associated symptoms: no dysuria        Home Medications Prior to Admission medications   Medication Sig Start Date End Date Taking? Authorizing Provider  benzonatate (TESSALON PERLES) 100 MG capsule Take 1 capsule (100 mg total) by mouth 3 (three) times daily as needed. 06/02/18   Daphine Deutscher, Mary-Margaret, FNP  PHENTERMINE HCL PO Take by mouth.    [provider]      Allergies    Patient has no known allergies.    Review of Systems   Review of Systems  Constitutional:  Positive for fever.  Gastrointestinal:  Positive for constipation.  Genitourinary:  Positive for frequency, scrotal swelling, testicular pain and urgency. Negative for dysuria, hematuria and penile discharge.  All other systems reviewed and are negative.   Physical Exam Updated Vital Signs BP (!) 141/96   Pulse 82   Temp 98.2 F (36.8 C)   Resp 18   Ht 6' (1.829 m)   Wt (!) 206.4 kg   SpO2 98%   BMI 61.71 kg/m  Physical Exam Vitals and nursing note reviewed. Exam conducted with a chaperone  present.  Constitutional:      Appearance: Normal appearance. He is obese.  HENT:     Head: Normocephalic and atraumatic.  Eyes:     Conjunctiva/sclera: Conjunctivae normal.  Cardiovascular:     Rate and Rhythm: Normal rate and regular rhythm.  Pulmonary:     Effort: Pulmonary effort is normal. No respiratory distress.     Breath sounds: Normal breath sounds.  Abdominal:     General: There is no distension.     Palpations: Abdomen is soft.     Tenderness: There is no abdominal tenderness.  Genitourinary:    Penis: Normal.      Testes:        Right: Tenderness present. Mass not present.        Left: Tenderness present. Mass not present.     Epididymis:     Left: Tenderness present.  Skin:    General: Skin is warm and dry.  Neurological:     General: No focal deficit present.     Mental Status: He is alert.     ED Results / Procedures / Treatments   Labs (all labs ordered are listed, but only abnormal results are displayed) Labs Reviewed  URINALYSIS, ROUTINE W REFLEX MICROSCOPIC - Abnormal; Notable for the following components:      Result Value   Color, Urine AMBER (*)    Hgb urine dipstick TRACE (*)  Bilirubin Urine SMALL (*)    Protein, ur 100 (*)    All other components within normal limits  CBC - Abnormal; Notable for the following components:   Hemoglobin 12.8 (*)    All other components within normal limits  BASIC METABOLIC PANEL - Abnormal; Notable for the following components:   Potassium 3.4 (*)    Glucose, Bld 124 (*)    Calcium 8.4 (*)    All other components within normal limits  URINALYSIS, MICROSCOPIC (REFLEX) - Abnormal; Notable for the following components:   Bacteria, UA FEW (*)    All other components within normal limits  URINE CULTURE    EKG None  Radiology CT Renal Stone Study  Result Date: 07/03/2022 CLINICAL DATA:  Left flank pain, urinary urgency, low-grade fever and testicular swelling. EXAM: CT ABDOMEN AND PELVIS WITHOUT  CONTRAST TECHNIQUE: Multidetector CT imaging of the abdomen and pelvis was performed following the standard protocol without IV contrast. RADIATION DOSE REDUCTION: This exam was performed according to the departmental dose-optimization program which includes automated exposure control, adjustment of the mA and/or kV according to patient size and/or use of iterative reconstruction technique. COMPARISON:  05/11/2014 FINDINGS: Lower chest: No acute abnormality. Hepatobiliary: Diffuse hepatic steatosis. No focal hepatic lesions identified. Status post cholecystectomy. No biliary dilatation. Pancreas: Unremarkable. No pancreatic ductal dilatation or surrounding inflammatory changes. Spleen: Normal in size without focal abnormality. Adrenals/Urinary Tract: Adrenal glands are unremarkable. Kidneys are normal, without renal calculi, focal lesion, or hydronephrosis. Bladder is nearly decompressed. Stomach/Bowel: Bowel shows no evidence of obstruction, ileus, inflammation or lesion. The appendix is normal. No free intraperitoneal air. Vascular/Lymphatic: No significant vascular findings are present. No enlarged abdominal or pelvic lymph nodes. Reproductive: Prostate is unremarkable. Other: Stable small umbilical hernia containing fat. No abdominopelvic ascites. Musculoskeletal: No acute or significant osseous findings. IMPRESSION: 1. No acute findings in the abdomen or pelvis. 2. Diffuse hepatic steatosis. 3. Stable small umbilical hernia. Electronically Signed   By: Irish Lack M.D.   On: 07/03/2022 12:13   US SCROTUM W/DOPPLER  Result Date: 07/03/2022 CLINICAL DATA:  Testicular swelling for 3 days. EXAM: SCROTAL ULTRASOUND DOPPLER ULTRASOUND OF THE TESTICLES TECHNIQUE: Complete ultrasound examination of the testicles, epididymis, and other scrotal structures was performed. Color and spectral Doppler ultrasound were also utilized to evaluate blood flow to the testicles. COMPARISON:  None Available. FINDINGS: Right  testicle Measurements: 4.3 x 2.6 x 3.0 cm. No mass visualized. Small microcalcification identified. Left testicle Measurements: 4.4 x 2.4 x 2.8 cm. Several small calcifications noted. There is a single, well-circumscribed, anechoic cyst measuring 6 x 5 x 6 mm. Right epididymis:  Normal in size and appearance. Left epididymis:  Normal in size and appearance. Hydrocele:  Small right hydrocele Varicocele:  None visualized. Pulsed Doppler interrogation of both testes demonstrates normal low resistance arterial and venous waveforms bilaterally. IMPRESSION: 1. No evidence for testicular torsion. 2. Small right hydrocele. 3. Simple appearing anechoic cyst within the left testis measures 6 mm. 4. Bilateral testicular microlithiasis. Current literature suggests that testicular microlithiasis is not a significant independent risk factor for development of testicular carcinoma, and that follow up imaging is not warranted in the absence of other risk factors. Monthly testicular self-examination and annual physical exams are considered appropriate surveillance. If patient has other risk factors for testicular carcinoma, then referral to Urology should be considered. (Reference: A 5-Year Follow up Study of Asymptomatic Men with Testicular Microlithiasis. Electronically Signed   By: Signa Kell M.D.   On: 07/03/2022  12:10    Procedures Procedures    Medications Ordered in ED Medications - No data to display  ED Course/ Medical Decision Making/ A&P                             Medical Decision Making Amount and/or Complexity of Data Reviewed Labs: ordered.   This patient is a 42 y.o. male  who presents to the ED for concern of testicular pain and urinary symptoms.   Differential diagnoses prior to evaluation: The emergent differential diagnosis includes, but is not limited to,  epididymitis, nephrolithiasis, UTI, testicular torsion. This is not an exhaustive differential.   Past Medical History /  Co-morbidities / Social History: Pancreatitis, morbid obesity  Physical Exam: Physical exam performed. The pertinent findings include: Mildly hypertensive, otherwise normal vital signs.  Abdomen without tenderness.  GU exam performed with chaperone present, significant for bilateral testicular tenderness, with some left-sided epididymal tenderness.  No masses appreciated.  Lab Tests/Imaging studies: I personally interpreted labs/imaging and the pertinent results include: No leukocytosis, stable hemoglobin.  BMP grossly unremarkable.  Urinalysis with trace hemoglobin, small bilirubin, few bacteria.  Scrotal ultrasound with normal blood flow bilaterally, small right hydrocele, simple cyst in the left testis measuring 6 mm. CT renal stone study with no acute findings. I agree with the radiologist interpretation.  Disposition: After consideration of the diagnostic results and the patients response to treatment, I feel that emergency department workup does not suggest an emergent condition requiring admission or immediate intervention beyond what has been performed at this time. The plan is: Discharged home with reassurance.  No emergent etiology found for symptoms today.  With ongoing urinary symptoms, without a convincing urinalysis, will send for culture.  While clinically I had some suspicion for epididymitis, urinalysis was negative and ultrasound unremarkable.  Possibility for passed kidney stone with some trace hemoglobin on UA. Will encourage over-the-counter medicines for fever and/or pain, good hydration, give close return precautions. Not septic, clinically well appearing. The patient is safe for discharge and has been instructed to return immediately for worsening symptoms, change in symptoms or any other concerns.  Final Clinical Impression(s) / ED Diagnoses Final diagnoses:  Urinary frequency  Febrile illness    Rx / DC Orders ED Discharge Orders     None      Portions of this  report may have been transcribed using voice recognition software. Every effort was made to ensure accuracy; however, inadvertent computerized transcription errors may be present.    Jeanella Flattery 07/03/22 1229    Terrilee Files, MD 07/03/22 1742

## 2022-07-03 NOTE — ED Triage Notes (Signed)
Pt states low grade fever since Friday with associated headaches  States testicle swelling that started on Friday with dark urine and soreness  States urgency and frequency

## 2022-07-04 ENCOUNTER — Encounter (HOSPITAL_BASED_OUTPATIENT_CLINIC_OR_DEPARTMENT_OTHER): Payer: Self-pay

## 2022-07-04 ENCOUNTER — Emergency Department (HOSPITAL_BASED_OUTPATIENT_CLINIC_OR_DEPARTMENT_OTHER)
Admission: EM | Admit: 2022-07-04 | Discharge: 2022-07-04 | Disposition: A | Discharge status: Home / Self Care | Payer: Federal, State, Local not specified - PPO | Attending: Emergency Medicine | Admitting: Emergency Medicine

## 2022-07-04 ENCOUNTER — Other Ambulatory Visit: Payer: Self-pay

## 2022-07-04 DIAGNOSIS — N50812 Left testicular pain: Secondary | ICD-10-CM | POA: Diagnosis not present

## 2022-07-04 DIAGNOSIS — N50811 Right testicular pain: Secondary | ICD-10-CM | POA: Insufficient documentation

## 2022-07-04 LAB — URINE CULTURE: Culture: NO GROWTH

## 2022-07-04 LAB — URINALYSIS, ROUTINE W REFLEX MICROSCOPIC
Glucose, UA: NEGATIVE mg/dL
Hgb urine dipstick: NEGATIVE
Ketones, ur: NEGATIVE mg/dL
Leukocytes,Ua: NEGATIVE
Nitrite: NEGATIVE
Protein, ur: 100 mg/dL — AB
Specific Gravity, Urine: >=1.03 (ref 1.005–1.030)
pH: 5.5 (ref 5.0–8.0)

## 2022-07-04 LAB — URINALYSIS, MICROSCOPIC (REFLEX)

## 2022-07-04 MED ORDER — CEFTRIAXONE SODIUM 1 G IJ SOLR
1.0000 g | Freq: Once | INTRAMUSCULAR | Status: AC
  Administered 2022-07-04: 1 g via INTRAMUSCULAR
  Filled 2022-07-04: qty 10

## 2022-07-04 MED ORDER — DOXYCYCLINE HYCLATE 100 MG PO TABS
100.0000 mg | ORAL_TABLET | Freq: Once | ORAL | Status: AC
  Administered 2022-07-04: 100 mg via ORAL
  Filled 2022-07-04: qty 1

## 2022-07-04 MED ORDER — STERILE WATER FOR INJECTION IJ SOLN
INTRAMUSCULAR | Status: AC
  Filled 2022-07-04: qty 10

## 2022-07-04 NOTE — ED Triage Notes (Signed)
Pt reports he was seen here yesterday for fever and swollen testicles. He was discharged home. He reports when he got home he looked at his results on mychart and saw there was blood in his urine. He states the doctor did not tell him that nor give him anything for it. He is here today because his testicles are still swollen, painful and his urine is still dark.

## 2022-07-04 NOTE — ED Provider Notes (Signed)
[place] [facility] Provider Note   CSN: [id number] Arrival date & time: 07/04/22  1655     History  Chief Complaint  Patient presents with   Hematuria    Adam Rosario is a 42 y.o. male.  42 yo M with a cc of painful testicles. Going on for the past couple days.  Fever.  Pain to both testicles.  Denies trauma.     Hematuria       Home Medications Prior to Admission medications   Medication Sig Start Date End Date Taking? Authorizing Provider  benzonatate (TESSALON PERLES) 100 MG capsule Take 1 capsule (100 mg total) by mouth 3 (three) times daily as needed. 06/02/18   Daphine Deutscher, Mary-Margaret, FNP  PHENTERMINE HCL PO Take by mouth.    [provider]      Allergies    Patient has no known allergies.    Review of Systems   Review of Systems  Genitourinary:  Positive for hematuria.    Physical Exam Updated Vital Signs BP (!) 165/102 (BP Location: Right Arm)   Pulse 96   Temp 98.7 F (37.1 C)   Resp 20   Ht 6\' 1"  (1.854 m)   Wt (!) 206.4 kg   SpO2 95%   BMI 60.03 kg/m  Physical Exam Vitals and nursing note reviewed.  Constitutional:      Appearance: He is well-developed.  HENT:     Head: Normocephalic and atraumatic.  Eyes:     Pupils: Pupils are equal, round, and reactive to light.  Neck:     Vascular: No JVD.  Cardiovascular:     Rate and Rhythm: Normal rate and regular rhythm.     Heart sounds: No murmur heard.    No friction rub. No gallop.  Pulmonary:     Effort: No respiratory distress.     Breath sounds: No wheezing.  Abdominal:     General: There is no distension.     Tenderness: There is no abdominal tenderness. There is no guarding or rebound.  Genitourinary:    Comments: Mild tenderness to bilateral testicles.  He has some overlying erythema mostly to the left side of the scrotum.  No obvious area of fluctuance.  No necrosis.  Not significantly tender. Musculoskeletal:        General:  Normal range of motion.     Cervical back: Normal range of motion and neck supple.  Skin:    Coloration: Skin is not pale.     Findings: No rash.  Neurological:     Mental Status: He is alert and oriented to person, place, and time.  Psychiatric:        Behavior: Behavior normal.     ED Results / Procedures / Treatments   Labs (all labs ordered are listed, but only abnormal results are displayed) Labs Reviewed  URINALYSIS, ROUTINE W REFLEX MICROSCOPIC - Abnormal; Notable for the following components:      Result Value   Color, Urine AMBER (*)    Bilirubin Urine SMALL (*)    Protein, ur 100 (*)    All other components within normal limits  URINALYSIS, MICROSCOPIC (REFLEX) - Abnormal; Notable for the following components:   Bacteria, UA RARE (*)    All other components within normal limits    EKG None  Radiology CT Renal Stone Study  Result Date: 07/03/2022 CLINICAL DATA:  Left flank pain, urinary urgency, low-grade fever and testicular swelling. EXAM: CT ABDOMEN AND PELVIS  WITHOUT CONTRAST TECHNIQUE: Multidetector CT imaging of the abdomen and pelvis was performed following the standard protocol without IV contrast. RADIATION DOSE REDUCTION: This exam was performed according to the departmental dose-optimization program which includes automated exposure control, adjustment of the mA and/or kV according to patient size and/or use of iterative reconstruction technique. COMPARISON:  05/11/2014 FINDINGS: Lower chest: No acute abnormality. Hepatobiliary: Diffuse hepatic steatosis. No focal hepatic lesions identified. Status post cholecystectomy. No biliary dilatation. Pancreas: Unremarkable. No pancreatic ductal dilatation or surrounding inflammatory changes. Spleen: Normal in size without focal abnormality. Adrenals/Urinary Tract: Adrenal glands are unremarkable. Kidneys are normal, without renal calculi, focal lesion, or hydronephrosis. Bladder is nearly decompressed. Stomach/Bowel:  Bowel shows no evidence of obstruction, ileus, inflammation or lesion. The appendix is normal. No free intraperitoneal air. Vascular/Lymphatic: No significant vascular findings are present. No enlarged abdominal or pelvic lymph nodes. Reproductive: Prostate is unremarkable. Other: Stable small umbilical hernia containing fat. No abdominopelvic ascites. Musculoskeletal: No acute or significant osseous findings. IMPRESSION: 1. No acute findings in the abdomen or pelvis. 2. Diffuse hepatic steatosis. 3. Stable small umbilical hernia. Electronically Signed   By: Irish Lack M.D.   On: 07/03/2022 12:13   US SCROTUM W/DOPPLER  Result Date: 07/03/2022 CLINICAL DATA:  Testicular swelling for 3 days. EXAM: SCROTAL ULTRASOUND DOPPLER ULTRASOUND OF THE TESTICLES TECHNIQUE: Complete ultrasound examination of the testicles, epididymis, and other scrotal structures was performed. Color and spectral Doppler ultrasound were also utilized to evaluate blood flow to the testicles. COMPARISON:  None Available. FINDINGS: Right testicle Measurements: 4.3 x 2.6 x 3.0 cm. No mass visualized. Small microcalcification identified. Left testicle Measurements: 4.4 x 2.4 x 2.8 cm. Several small calcifications noted. There is a single, well-circumscribed, anechoic cyst measuring 6 x 5 x 6 mm. Right epididymis:  Normal in size and appearance. Left epididymis:  Normal in size and appearance. Hydrocele:  Small right hydrocele Varicocele:  None visualized. Pulsed Doppler interrogation of both testes demonstrates normal low resistance arterial and venous waveforms bilaterally. IMPRESSION: 1. No evidence for testicular torsion. 2. Small right hydrocele. 3. Simple appearing anechoic cyst within the left testis measures 6 mm. 4. Bilateral testicular microlithiasis. Current literature suggests that testicular microlithiasis is not a significant independent risk factor for development of testicular carcinoma, and that follow up imaging is not  warranted in the absence of other risk factors. Monthly testicular self-examination and annual physical exams are considered appropriate surveillance. If patient has other risk factors for testicular carcinoma, then referral to Urology should be considered. (Reference: A 5-Year Follow up Study of Asymptomatic Men with Testicular Microlithiasis. Electronically Signed   By: Signa Kell M.D.   On: 07/03/2022 12:10    Procedures Procedures    Medications Ordered in ED Medications  sterile water (preservative free) injection (has no administration in time range)  cefTRIAXone (ROCEPHIN) injection 1 g (1 g Intramuscular Given 07/04/22 1805)  doxycycline (VIBRA-TABS) tablet 100 mg (100 mg Oral Given 07/04/22 1803)    ED Course/ Medical Decision Making/ A&P                             Medical Decision Making Amount and/or Complexity of Data Reviewed Labs: ordered.  Risk Prescription drug management.   42 yo M with a  cc of testicle pain.  This been going on for a few days.  Atraumatic.  Was seen yesterday and had CT imaging as well as ultrasound.  He is here because  he has had some worsening pain.  He has not been performing scrotal elevation.  I am not sure a repeat imaging study would be beneficial.  UA still appearing clean but with him describing fevers and ongoing testicular pain and some signs of possibly cellulitis to the scrotum will start on antibiotics.  Will have him follow-up with urology in the office.  7:20 PM:  I have discussed the diagnosis/risks/treatment options with the patient.  Evaluation and diagnostic testing in the emergency department does not suggest an emergent condition requiring admission or immediate intervention beyond what has been performed at this time.  They will follow up with PCP. We also discussed returning to the ED immediately if new or worsening sx occur. We discussed the sx which are most concerning (e.g., sudden worsening pain, fever, inability to  tolerate by mouth) that necessitate immediate return. Medications administered to the patient during their visit and any new prescriptions provided to the patient are listed below.  Medications given during this visit Medications  sterile water (preservative free) injection (has no administration in time range)  cefTRIAXone (ROCEPHIN) injection 1 g (1 g Intramuscular Given 07/04/22 1805)  doxycycline (VIBRA-TABS) tablet 100 mg (100 mg Oral Given 07/04/22 1803)     The patient appears reasonably screen and/or stabilized for discharge and I doubt any other medical condition or other [facility] requiring further screening, evaluation, or treatment in the ED at this time prior to discharge.          Final Clinical Impression(s) / ED Diagnoses Final diagnoses:  Pain in both testicles    Rx / DC Orders ED Discharge Orders     None         Melene Plan, DO 07/04/22 1920

## 2022-07-04 NOTE — Discharge Instructions (Signed)
Take 4 over the counter ibuprofen tablets 3 times a day or 2 over-the-counter naproxen tablets twice a day for pain. Also take tylenol 1000mg (2 extra strength) four times a day.    Please follow-up with the urologist in the office.  I have you call them tomorrow.  The thing that tends to help the best with this is scrotal elevation usually that is tight fitting underwear.

## 2022-11-14 HISTORY — PX: LAPAROSCOPIC GASTRIC SLEEVE RESECTION: SHX5895

## 2023-09-21 ENCOUNTER — Other Ambulatory Visit: Payer: Self-pay

## 2023-09-21 ENCOUNTER — Encounter (HOSPITAL_BASED_OUTPATIENT_CLINIC_OR_DEPARTMENT_OTHER): Payer: Self-pay | Admitting: Emergency Medicine

## 2023-09-21 ENCOUNTER — Emergency Department (HOSPITAL_BASED_OUTPATIENT_CLINIC_OR_DEPARTMENT_OTHER)
Admission: EM | Admit: 2023-09-21 | Discharge: 2023-09-21 | Disposition: A | Discharge status: Home / Self Care | Attending: Emergency Medicine | Admitting: Emergency Medicine

## 2023-09-21 DIAGNOSIS — R350 Frequency of micturition: Secondary | ICD-10-CM | POA: Diagnosis not present

## 2023-09-21 DIAGNOSIS — M5431 Sciatica, right side: Secondary | ICD-10-CM | POA: Diagnosis not present

## 2023-09-21 DIAGNOSIS — M545 Low back pain, unspecified: Secondary | ICD-10-CM | POA: Diagnosis present

## 2023-09-21 LAB — URINALYSIS, ROUTINE W REFLEX MICROSCOPIC
Bilirubin Urine: NEGATIVE
Glucose, UA: NEGATIVE mg/dL
Hgb urine dipstick: NEGATIVE
Ketones, ur: NEGATIVE mg/dL
Nitrite: NEGATIVE
Protein, ur: NEGATIVE mg/dL
Specific Gravity, Urine: 1.02 (ref 1.005–1.030)
pH: 7 (ref 5.0–8.0)

## 2023-09-21 LAB — URINALYSIS, MICROSCOPIC (REFLEX)

## 2023-09-21 MED ORDER — METHOCARBAMOL 500 MG PO TABS: 500.0000 mg | ORAL_TABLET | Freq: Two times a day (BID) | ORAL | 0 refills | Status: AC

## 2023-09-21 MED ORDER — LIDOCAINE 5 % EX PTCH
1.0000 | MEDICATED_PATCH | CUTANEOUS | Status: DC
  Administered 2023-09-21: 1 via TRANSDERMAL
  Filled 2023-09-21: qty 1

## 2023-09-21 NOTE — Discharge Instructions (Addendum)
 Take Tylenol , you can apply Voltaren Gel (over the counter) to area to help with pain. Can also use lidocaine  patches.  Robaxin  prescribed, do not take this if you are driving or operating machinery.  A culture has been sent out on your urine, medication can be sent to your pharmacy if this is positive for urinary tract infection.

## 2023-09-21 NOTE — ED Triage Notes (Addendum)
 Pt c/o R lower back pain, hx of sciatica, x 3 days. Also c/o increased urinary frequency x2-3 days. Denies fever, discharge.   Also reports R hip numbness x 3 days. Denies known injury.

## 2023-09-21 NOTE — ED Provider Notes (Signed)
 [place] [facility] AT [facility] Provider Note   CSN: [id number] Arrival date & time: 09/21/23  1216     Patient presents with: Hip Pain and Urinary Frequency   Adam Rosario is a 43 y.o. male.   For 73-year-old male presents with complaint of pain in his right lower back which radiates to lateral right thigh described as a numb sensation.  This started a few days ago, feels similar to his prior sciatica.  Denies falls or injuries.  Patient works as a [employer], states that his sciatica initially got better with weight loss following gastric sleeve surgery.  He is unable to take NSAIDs due to his gastric sleeve surgery, states he was previously prescribed Flexeril or similar medication.  Also notes urinary frequency without dysuria or discharge.       Prior to Admission medications   Medication Sig Start Date End Date Taking? Authorizing Provider  methocarbamol  (ROBAXIN ) 500 MG tablet Take 1 tablet (500 mg total) by mouth 2 (two) times daily. 09/21/23  Yes Beverley Leita LABOR, PA-C  benzonatate  (TESSALON  PERLES) 100 MG capsule Take 1 capsule (100 mg total) by mouth 3 (three) times daily as needed. 06/02/18   Gladis, Mary-Margaret, FNP  PHENTERMINE HCL PO Take by mouth.    [provider]    Allergies: Patient has no known allergies.    Review of Systems Negative except as per HPI Updated Vital Signs BP 136/89 (BP Location: Left Arm)   Pulse 78   Temp 98 F (36.7 C) (Oral)   Resp 20   Ht 6' 1 (1.854 m)   Wt (!) 185.5 kg   SpO2 97%   BMI 53.96 kg/m   Physical Exam Vitals and nursing note reviewed.  Constitutional:      General: He is not in acute distress.    Appearance: He is well-developed. He is obese. He is not diaphoretic.  HENT:     Head: Normocephalic and atraumatic.  Pulmonary:     Effort: Pulmonary effort is normal.  Musculoskeletal:        General: Tenderness present. No swelling or deformity.     Lumbar back: No tenderness  or bony tenderness. Positive left straight leg raise test. Negative right straight leg raise test.       Back:  Skin:    General: Skin is warm and dry.     Findings: No erythema or rash.  Neurological:     Mental Status: He is alert and oriented to person, place, and time.     Sensory: Sensation is intact. No sensory deficit.     Motor: No weakness.     Gait: Gait is intact.  Psychiatric:        Behavior: Behavior normal.     (all labs ordered are listed, but only abnormal results are displayed) Labs Reviewed  URINALYSIS, ROUTINE W REFLEX MICROSCOPIC - Abnormal; Notable for the following components:      Result Value   Leukocytes,Ua TRACE (*)    All other components within normal limits  URINALYSIS, MICROSCOPIC (REFLEX) - Abnormal; Notable for the following components:   Bacteria, UA RARE (*)    All other components within normal limits  URINE CULTURE    EKG: EKG Interpretation Date/Time:  Friday September 21 2023 12:27:27 EDT Ventricular Rate:  76 PR Interval:  160 QRS Duration:  78 QT Interval:  383 QTC Calculation: 431 R Axis:   44  Text Interpretation: Sinus rhythm Baseline wander in lead(s)  V3 no acute ischemic appearance, no sig change from previous Confirmed by Armenta Canning [phone]) on 09/21/2023 12:53:23 PM  Radiology: No results found.   Procedures   Medications Ordered in the ED  lidocaine  (LIDODERM ) 5 % 1 patch (has no administration in time range)                                    Medical Decision Making Amount and/or Complexity of Data Reviewed Labs: ordered.  Risk Prescription drug management.   43 year old male with complaint of right SI pain with history of sciatica.  Has tenderness over right SI joint, sensation motor intact, gait intact.  Suspect sciatica.  Straight leg raise positive with left leg extension reproducing right sided pain.  He is provided with prescription for Robaxin  and advised not to drive if taking this medication.  He  can also try lidocaine  patches and Voltaren gel as well as warm compresses.  Provided with rehab instructions and referral to [facility] as he does not have PCP on file. Regarding his urinary frequency, no history of diabetes, no glucose in his urine.  He does have trace leukocytes with rare bacteria.  No reports of dysuria or discharge.  Sent for culture, if positive advised he may be per called in antibiotics.     Final diagnoses:  Sciatica of right side  Urinary frequency    ED Discharge Orders          Ordered    methocarbamol  (ROBAXIN ) 500 MG tablet  2 times daily        09/21/23 1307               Beverley Leita LABOR, PA-C 09/21/23 1311    Armenta Canning, MD 09/27/23 [phone]

## 2023-09-21 NOTE — ED Notes (Signed)
 ED Provider at bedside.

## 2023-09-22 LAB — URINE CULTURE: Culture: NO GROWTH
